# Patient Record
Sex: Female | Born: 1935 | Race: White | Hispanic: No | Smoking: Never smoker
Health system: Southern US, Community
[De-identification: ages and names within clinical notes are randomized; demographics above are authoritative.]

## PROBLEM LIST (undated history)

## (undated) DIAGNOSIS — G8929 Other chronic pain: Secondary | ICD-10-CM

## (undated) DIAGNOSIS — M1990 Unspecified osteoarthritis, unspecified site: Secondary | ICD-10-CM

## (undated) DIAGNOSIS — I10 Essential (primary) hypertension: Secondary | ICD-10-CM

## (undated) DIAGNOSIS — C50919 Malignant neoplasm of unspecified site of unspecified female breast: Secondary | ICD-10-CM

## (undated) HISTORY — PX: APPENDECTOMY: SHX54

## (undated) HISTORY — PX: OTHER SURGICAL HISTORY: SHX169

## (undated) HISTORY — PX: ABDOMINAL HYSTERECTOMY: SHX81

---

## 2011-05-05 ENCOUNTER — Emergency Department (HOSPITAL_COMMUNITY): Payer: Medicare Other

## 2011-05-05 ENCOUNTER — Observation Stay (HOSPITAL_COMMUNITY)
Admission: EM | Admit: 2011-05-05 | Discharge: 2011-05-09 | Disposition: A | Discharge status: Home Health | Payer: Medicare Other | Attending: Internal Medicine | Admitting: Internal Medicine

## 2011-05-05 ENCOUNTER — Encounter: Payer: Self-pay | Admitting: Emergency Medicine

## 2011-05-05 DIAGNOSIS — E119 Type 2 diabetes mellitus without complications: Secondary | ICD-10-CM | POA: Diagnosis present

## 2011-05-05 DIAGNOSIS — M25551 Pain in right hip: Secondary | ICD-10-CM | POA: Diagnosis present

## 2011-05-05 DIAGNOSIS — Z9181 History of falling: Secondary | ICD-10-CM | POA: Insufficient documentation

## 2011-05-05 DIAGNOSIS — M25559 Pain in unspecified hip: Secondary | ICD-10-CM | POA: Insufficient documentation

## 2011-05-05 DIAGNOSIS — E162 Hypoglycemia, unspecified: Secondary | ICD-10-CM

## 2011-05-05 DIAGNOSIS — Z79899 Other long term (current) drug therapy: Secondary | ICD-10-CM | POA: Insufficient documentation

## 2011-05-05 DIAGNOSIS — Z66 Do not resuscitate: Secondary | ICD-10-CM | POA: Insufficient documentation

## 2011-05-05 DIAGNOSIS — M25552 Pain in left hip: Secondary | ICD-10-CM

## 2011-05-05 DIAGNOSIS — M169 Osteoarthritis of hip, unspecified: Secondary | ICD-10-CM | POA: Insufficient documentation

## 2011-05-05 DIAGNOSIS — R531 Weakness: Secondary | ICD-10-CM

## 2011-05-05 DIAGNOSIS — I10 Essential (primary) hypertension: Secondary | ICD-10-CM | POA: Diagnosis present

## 2011-05-05 DIAGNOSIS — M1610 Unilateral primary osteoarthritis, unspecified hip: Principal | ICD-10-CM | POA: Insufficient documentation

## 2011-05-05 DIAGNOSIS — C50919 Malignant neoplasm of unspecified site of unspecified female breast: Secondary | ICD-10-CM | POA: Diagnosis present

## 2011-05-05 HISTORY — DX: Other chronic pain: G89.29

## 2011-05-05 HISTORY — DX: Unspecified osteoarthritis, unspecified site: M19.90

## 2011-05-05 HISTORY — DX: Malignant neoplasm of unspecified site of unspecified female breast: C50.919

## 2011-05-05 HISTORY — DX: Essential (primary) hypertension: I10

## 2011-05-05 LAB — GLUCOSE, CAPILLARY
Glucose-Capillary: 50 mg/dL — ABNORMAL LOW (ref 70–99)
Glucose-Capillary: 73 mg/dL (ref 70–99)
Glucose-Capillary: 101 mg/dL — ABNORMAL HIGH (ref 70–99)

## 2011-05-05 LAB — CBC
MCHC: 32.2 g/dL (ref 30.0–36.0)
Platelets: 263 10*3/uL (ref 150–400)
RDW: 14.5 % (ref 11.5–15.5)
WBC: 9.7 10*3/uL (ref 4.0–10.5)

## 2011-05-05 LAB — BASIC METABOLIC PANEL
Chloride: 107 mEq/L (ref 96–112)
Creatinine, Ser: 0.99 mg/dL (ref 0.50–1.10)
GFR calc Af Amer: 54 mL/min — ABNORMAL LOW (ref >90)
Sodium: 143 mEq/L (ref 135–145)

## 2011-05-05 LAB — DIFFERENTIAL
Basophils Absolute: 0 10*3/uL (ref 0.0–0.1)
Basophils Relative: 0 % (ref 0–1)
Monocytes Absolute: 0.7 10*3/uL (ref 0.1–1.0)
Neutro Abs: 7.4 10*3/uL (ref 1.7–7.7)
Neutrophils Relative %: 76 % (ref 43–77)

## 2011-05-05 MED ORDER — MORPHINE SULFATE 2 MG/ML IJ SOLN: 1.0000 mg | Freq: Once | INTRAMUSCULAR | Status: DC

## 2011-05-05 MED ORDER — LETROZOLE 2.5 MG PO TABS
2.5000 mg | ORAL_TABLET | Freq: Every day | ORAL | Status: DC
  Administered 2011-05-06 – 2011-05-09 (×4): 2.5 mg via ORAL
  Filled 2011-05-05 (×8): qty 1

## 2011-05-05 MED ORDER — INSULIN GLARGINE 100 UNIT/ML ~~LOC~~ SOLN: 20.0000 [IU] | Freq: Every day | SUBCUTANEOUS | Status: DC

## 2011-05-05 MED ORDER — MORPHINE SULFATE 10 MG/ML IJ SOLN
2.0000 mg | Freq: Once | INTRAMUSCULAR | Status: AC
  Administered 2011-05-05: 2 mg via INTRAMUSCULAR
  Filled 2011-05-05: qty 1

## 2011-05-05 MED ORDER — MORPHINE SULFATE 4 MG/ML IJ SOLN
INTRAMUSCULAR | Status: AC
  Administered 2011-05-05: 2 mg
  Filled 2011-05-05: qty 1

## 2011-05-05 MED ORDER — SENNOSIDES-DOCUSATE SODIUM 8.6-50 MG PO TABS
1.0000 | ORAL_TABLET | Freq: Every day | ORAL | Status: DC | PRN
  Filled 2011-05-05: qty 1

## 2011-05-05 MED ORDER — SODIUM CHLORIDE 0.9 % IV SOLN
Freq: Once | INTRAVENOUS | Status: AC
  Administered 2011-05-05: 12:00:00 via INTRAVENOUS

## 2011-05-05 MED ORDER — GADOBENATE DIMEGLUMINE 529 MG/ML IV SOLN
15.0000 mL | Freq: Once | INTRAVENOUS | Status: AC | PRN
  Administered 2011-05-05: 15 mL via INTRAVENOUS

## 2011-05-05 MED ORDER — HYDROCHLOROTHIAZIDE 25 MG PO TABS
25.0000 mg | ORAL_TABLET | Freq: Every day | ORAL | Status: DC
  Administered 2011-05-06 – 2011-05-09 (×4): 25 mg via ORAL
  Filled 2011-05-05 (×7): qty 1

## 2011-05-05 MED ORDER — POTASSIUM CHLORIDE CRYS ER 20 MEQ PO TBCR
20.0000 meq | EXTENDED_RELEASE_TABLET | Freq: Every day | ORAL | Status: DC
  Administered 2011-05-06 – 2011-05-09 (×4): 20 meq via ORAL
  Filled 2011-05-05 (×4): qty 1

## 2011-05-05 MED ORDER — SODIUM CHLORIDE 0.9 % IJ SOLN
INTRAMUSCULAR | Status: AC
  Administered 2011-05-05: 10 mL
  Filled 2011-05-05: qty 3

## 2011-05-05 MED ORDER — ONDANSETRON HCL 4 MG PO TABS: 4.0000 mg | ORAL_TABLET | Freq: Four times a day (QID) | ORAL | Status: DC | PRN

## 2011-05-05 MED ORDER — ACETAMINOPHEN 325 MG PO TABS: 650.0000 mg | ORAL_TABLET | Freq: Four times a day (QID) | ORAL | Status: DC | PRN

## 2011-05-05 MED ORDER — LORAZEPAM 2 MG/ML IJ SOLN
1.0000 mg | INTRAMUSCULAR | Status: DC | PRN
  Administered 2011-05-05 – 2011-05-06 (×3): 1 mg via INTRAVENOUS
  Filled 2011-05-05 (×3): qty 1

## 2011-05-05 MED ORDER — SODIUM CHLORIDE 0.9 % IV SOLN
INTRAVENOUS | Status: DC
  Administered 2011-05-05 – 2011-05-06 (×2): via INTRAVENOUS

## 2011-05-05 MED ORDER — ZOLPIDEM TARTRATE 5 MG PO TABS: 5.0000 mg | ORAL_TABLET | Freq: Every evening | ORAL | Status: DC | PRN

## 2011-05-05 MED ORDER — HYDROCODONE-ACETAMINOPHEN 5-325 MG PO TABS: 1.0000 | ORAL_TABLET | ORAL | Status: DC | PRN

## 2011-05-05 MED ORDER — ONDANSETRON HCL 4 MG/2ML IJ SOLN: 4.0000 mg | Freq: Four times a day (QID) | INTRAMUSCULAR | Status: DC | PRN

## 2011-05-05 MED ORDER — HEPARIN SODIUM (PORCINE) 5000 UNIT/ML IJ SOLN
5000.0000 [IU] | Freq: Three times a day (TID) | INTRAMUSCULAR | Status: DC
  Administered 2011-05-05 – 2011-05-09 (×12): 5000 [IU] via SUBCUTANEOUS
  Filled 2011-05-05 (×19): qty 1

## 2011-05-05 MED ORDER — MORPHINE SULFATE 10 MG/ML IJ SOLN: 2.0000 mg | Freq: Once | INTRAMUSCULAR | Status: DC

## 2011-05-05 MED ORDER — BACITRACIN ZINC 500 UNIT/GM EX OINT
TOPICAL_OINTMENT | CUTANEOUS | Status: AC
  Filled 2011-05-05: qty 0.9

## 2011-05-05 MED ORDER — OXYCODONE HCL 5 MG PO TABS
5.0000 mg | ORAL_TABLET | ORAL | Status: DC | PRN
  Administered 2011-05-05 – 2011-05-08 (×9): 5 mg via ORAL
  Filled 2011-05-05 (×9): qty 1

## 2011-05-05 MED ORDER — ACETAMINOPHEN 650 MG RE SUPP: 650.0000 mg | Freq: Four times a day (QID) | RECTAL | Status: DC | PRN

## 2011-05-05 MED ORDER — MORPHINE SULFATE 2 MG/ML IJ SOLN: 2.0000 mg | INTRAMUSCULAR | Status: DC | PRN

## 2011-05-05 MED ORDER — ONDANSETRON 4 MG PO TBDP
4.0000 mg | ORAL_TABLET | Freq: Once | ORAL | Status: AC
  Administered 2011-05-05: 4 mg via ORAL
  Filled 2011-05-05: qty 1

## 2011-05-05 NOTE — ED Provider Notes (Signed)
History     CSN: [id number] Arrival date & time: 05/05/2011  8:36 AM   First MD Initiated Contact with Patient 05/05/11 0845      Chief Complaint  Patient presents with  . Hip Pain    (Consider location/radiation/quality/duration/timing/severity/associated sxs/prior treatment) HPI Comments: Patient went to bathroom early this morning and  c/o left hip and thigh pain after she slipped and fell against the door in her bathroom.  Her daughter states she was with her at the time and she assisted her to the floor.  Daughter denies head or neck injury. Patient denies preceding symptoms  Patient is a 77 y.o. female presenting with hip pain. The history is provided by the patient (daughter).  Hip Pain This is a new problem. The current episode started today. The problem occurs constantly. The problem has been unchanged. Associated symptoms include arthralgias and myalgias. Pertinent negatives include no abdominal pain, chest pain, chills, coughing, fatigue, fever, headaches, joint swelling, nausea, neck pain, numbness, rash, sore throat, visual change, vomiting or weakness. Exacerbated by: movement and palpation. She has tried nothing for the symptoms. The treatment provided no relief.    Past Medical History  Diagnosis Date  . Chronic pain   . Hypertension   . Breast cancer   . Arthritis     Past Surgical History  Procedure Date  . Unknown   . Appendectomy   . Abdominal hysterectomy     partial    History reviewed. No pertinent family history.  History  Substance Use Topics  . Smoking status: Never Smoker   . Smokeless tobacco: Not on file  . Alcohol Use: No    OB History    Grav Para Term Preterm Abortions TAB SAB Ect Mult Living                  Review of Systems  Constitutional: Negative for fever, chills and fatigue.  HENT: Negative for sore throat, trouble swallowing, neck pain and neck stiffness.   Respiratory: Negative for cough, shortness of breath and  wheezing.   Cardiovascular: Negative for chest pain and palpitations.  Gastrointestinal: Negative for nausea, vomiting and abdominal pain.  Genitourinary: Negative for dysuria, hematuria and flank pain.  Musculoskeletal: Positive for myalgias and arthralgias. Negative for back pain and joint swelling.  Skin: Negative for rash.  Neurological: Negative for dizziness, speech difficulty, weakness, numbness and headaches.  Hematological: Does not bruise/bleed easily.  Psychiatric/Behavioral: Negative for confusion.  All other systems reviewed and are negative.    Allergies  Review of patient's allergies indicates no known allergies.  Home Medications  No current outpatient prescriptions on file.  BP 120/67  Pulse 102  Temp(Src) 97.3 F (36.3 C) (Oral)  Resp 19  SpO2 97%  Physical Exam  Nursing note and vitals reviewed. Constitutional: She is oriented to person, place, and time. She appears well-developed and well-nourished. No distress.  HENT:  Head: Normocephalic and atraumatic.  Mouth/Throat: Oropharynx is clear and moist.  Eyes: EOM are normal. Pupils are equal, round, and reactive to light.  Neck: Normal range of motion. Neck supple.  Cardiovascular: Normal rate, regular rhythm and normal heart sounds.   Pulmonary/Chest: Effort normal and breath sounds normal. No respiratory distress. She exhibits no tenderness.  Musculoskeletal: She exhibits tenderness. She exhibits no edema.       Left hip: She exhibits decreased range of motion, tenderness and bony tenderness. She exhibits no swelling, no crepitus, no deformity and no laceration.  ROM of the left hip is limited due to level of pain.  No deformity, bruising, abrasions or edema.  No shortening of the left leg on exam.  DP pulse is brisk.    Lymphadenopathy:    She has no cervical adenopathy.  Neurological: She is alert and oriented to person, place, and time. No cranial nerve deficit. She exhibits normal muscle tone.  Coordination normal.  Skin: Skin is warm and dry.    ED Course  Procedures (including critical care time)       MDM   Date: 05/05/2011  Rate: 93  Rhythm: normal sinus rhythm  QRS Axis: normal  Intervals: normal  ST/T Wave abnormalities: normal  Conduction Disutrbances:none  Narrative Interpretation:   Old EKG Reviewed: none available  EKG read by Dr. Brooke Dare    patient has ttp of the left anterolateral hip w/o deformity or external rotation.  Appears uncomfortable  12:37 PM patient continues to be unable to bear weight, blood sugar improved, now c/o generalized dizziness with position change.  Has drank fluids w/o difficulty.  Labs pending, will consult for admission    1:11 PM consulted Dr. Karilyn Cota for admission, will evaluate pt in ED.        Katherine Meyer, [place] 05/05/11 1312

## 2011-05-05 NOTE — ED Notes (Signed)
Blood sugar 50, let RN know

## 2011-05-05 NOTE — ED Notes (Signed)
Pt c/o dizziness with position change, orthostatic vs obtained.  Attempted to transfer pt to bedside commode, pt unable to stand, PA notified.  Returned pt to bed, repositioned for comfort.  Female urinal used.  nad noted at this time.

## 2011-05-05 NOTE — ED Notes (Signed)
Lunch tray given.  Pt more comfortable, no longer rigid/moaning.  Pt relaxed.  Sitting up eating meal tray.  nad noted.  Family at bedside.

## 2011-05-05 NOTE — ED Provider Notes (Signed)
Medical screening examination/treatment/procedure(s) were conducted as a shared visit with non-physician practitioner(s) and myself.  I personally evaluated the patient during the encounter  L hip Pain after sliding down a door when losing balance.  Difficulty with ambulation and typical is able to ambulate with assistance.  No neuro findings.  No loss bowel or bladder function.  No additional injury  Pain with ROM L hip and L knee.  Unable to ambulate  Pain control, labs, imaging and likely admit  Dayton Bailiff, MD 05/05/11 1354

## 2011-05-05 NOTE — ED Notes (Signed)
Pt was about to fall early this am and daughter caught her to slide down in floor. ems called but then did not want them to take pt to ED. Pt has been c/o pain to L upper leg and L hip since so daughter called EMS again to bring here. Pt states she did fall in bathroom before daughter got to her. Pt is alert/oriented to most. Denies pain to head. No obvious deformity and ROM wnl at this time. No shortening noticed. Pedal pulses strong.

## 2011-05-05 NOTE — ED Notes (Signed)
MRI called stating pt is too combative and agitated to complete MRI.  Dr. Alphonzo Grieve notified and orders received.

## 2011-05-05 NOTE — H&P (Signed)
Katherine Meyer MRN: [id number] DOB/AGE: [birth year] 77 y.o. Primary Care Physician:MARTIN,MARY MARGARET, FNP, FNP Admit date: 05/05/2011 Chief Complaint: Bilateral hip pain. HPI: This 77 year old lady presents to the hospital with right greater than left hip pain after having had a fall at home. She apparently was walking with her walker to the restroom. She suddenly told her daughter that she was about to fall. Her daughter rushed to her aid and the patient fell backwards on top of her daughter, who broke her fall and therefore she did not have a severe fall to the floor. However, after this fall, she was not able to walk. The patient is hard of hearing and therefore I am not able to get a clear history. In particular, she is not able to tell me if she has had significant pain in the hips prior to this fall. Her caregiver, who is at the bedside, tells me that yesterday she was complaining of pain in her lower back.  This patient has been diagnosed with right breast cancer appro.ximately one year ago for which she is taking Femara. In the emergency room, plain x-rays of the pelvis do not show any fracture but severe degenerative  Joint disease.     Past medical history: 1. Right breast cancer diagnosed approximately one year ago. Her oncologist is Dr. Cleone Slim. 2. Hypertension. 3. Diabetes, insulin-dependent.  Past surgical history: 1. Appendectomy. 2. Total abdominal hysterectomy.        Family history: Noncontributory.   social history: She is widowed. She lives with her daughter. She has a caregiver for most of the day time. The patient needs help with dressing and bathing but is able to feed herself. The patient can walk with a walker but spends most of her time in a wheelchair.   Allergies: No Known Allergies  Medications Prior to Admission  Medication Dose Route Frequency Provider Last Rate Last Dose  . 0.9 %  sodium chloride infusion   Intravenous Once Tammy L. Triplett, PA 100  mL/hr at 05/05/11 1157    . 0.9 %  sodium chloride infusion   Intravenous Continuous Nimish C Gosrani      . acetaminophen (TYLENOL) tablet 650 mg  650 mg Oral Q6H PRN Nimish C Gosrani       Or  . acetaminophen (TYLENOL) suppository 650 mg  650 mg Rectal Q6H PRN Nimish C Gosrani      . bacitracin 500 UNIT/GM ointment           . heparin injection 5,000 Units  5,000 Units Subcutaneous Q8H Nimish C Gosrani      . hydrochlorothiazide (HYDRODIURIL) tablet 25 mg  25 mg Oral Daily Nimish C Gosrani      . HYDROcodone-acetaminophen (NORCO) 5-325 MG per tablet 1 tablet  1 tablet Oral Q4H PRN Nimish C Gosrani      . insulin glargine (LANTUS) injection 20 Units  20 Units Subcutaneous Daily Nimish C Gosrani      . letrozole [facility]) tablet 2.5 mg  2.5 mg Oral Daily Nimish C Gosrani      . morphine 2 MG/ML injection 2 mg  2 mg Intravenous Q4H PRN Nimish C Gosrani      . morphine 4 MG/ML injection        2 mg at 05/05/11 0910  . morphine 4 MG/ML injection        2 mg at 05/05/11 1250  . morphine injection 2 mg  2 mg Intramuscular Once Tammy L. Triplett, PA  2 mg at 05/05/11 1021  . ondansetron (ZOFRAN) tablet 4 mg  4 mg Oral Q6H PRN Nimish C Gosrani       Or  . ondansetron (ZOFRAN) injection 4 mg  4 mg Intravenous Q6H PRN Nimish C Gosrani      . ondansetron (ZOFRAN-ODT) disintegrating tablet 4 mg  4 mg Oral Once Tammy L. Triplett, PA   4 mg at 05/05/11 0910  . potassium chloride SA (K-DUR,KLOR-CON) CR tablet 20 mEq  20 mEq Oral Daily Nimish C Gosrani      . senna-docusate (Senokot-S) tablet 1 tablet  1 tablet Oral Daily PRN Nimish C Gosrani      . zolpidem (AMBIEN) tablet 5 mg  5 mg Oral QHS PRN Nimish C Gosrani      . DISCONTD: morphine 2 MG/ML injection 1 mg  1 mg Intravenous Once Tammy L. Triplett, PA      . DISCONTD: morphine injection 2 mg  2 mg Intramuscular Once Tammy L. Triplett, PA       No current outpatient prescriptions on file as of 05/05/2011.       ZOX:WRUEA from the symptoms  mentioned above,there are no other symptoms referable to all systems reviewed.  Physical Exam: Blood pressure 138/64, pulse 88, temperature 97.3 F (36.3 C), temperature source Oral, resp. rate 20, SpO2 98.00%.  She looks very frail and is in pain on minimal movement. Heart sounds are present and normal without murmurs. Lung fields are clear. She is very hard of hearing but appears to be alert and orientated. Right breast examination shows a mass in the lower inner quadrant consistent with malignancy. Her abdomen is soft and nontender. There is no hepatomegaly. The right hip appears to be tender more than her left hip.    Basename 05/05/11 1200  WBC 9.7  NEUTROABS 7.4  HGB 11.7*  HCT 36.3  MCV 87.5  PLT 263    Basename 05/05/11 1200  NA 143  K 3.8  CL 107  CO2 28  GLUCOSE 91  BUN 25*  CREATININE 0.99  CALCIUM 10.0  MG --         Dg Lumbar Spine Complete  05/05/2011  *RADIOLOGY REPORT*  Clinical Data: Post fall, now with hip and back pain  LUMBAR SPINE - COMPLETE 4+ VIEW  Comparison: Pelvic radiographs - earlier same day  Findings:  There are five non-rib bearing lumbar type vertebral bodies.  There is mild scoliotic curvature of the lumbar spine.  No definite anterolisthesis or retrolisthesis.  No definite pars defects. Vertebral body heights are grossly preserved.  There is mild DDD of T12 - L1 and L3 - L4.  Limited visualization of the bilateral SI joints is normal.  Vascular calcifications.  Punctate calcifications overlying the upper abdomen may be pancreatic in etiology.  Severe degenerative change of the bilateral hips. Indeterminate sclerotic lesion within the right pelvis is incompletely evaluated but may represent a bone island.  IMPRESSION: 1.  Diffuse osteopenia without definite fracture. 2.  Mild multilevel DDD, most conspicuous at T12 - L1 and L3 - L4.  Original Report Authenticated By: Waynard Reeds, M.D.   Dg Pelvis 1-2 Views  05/05/2011  *RADIOLOGY REPORT*   Clinical Data: Post fall, now with hip and low back pain.  PELVIS - 1-2 VIEW  Comparison: Left femur radiographs - earlier same day  Findings: Diffuse osteopenia without definite fracture.  There is severe degenerative change of the bilateral hips with bilateral hip joint space loss with  bone on bone articulation, subchondral sclerosis and osteophytosis.  The regional soft tissues are normal.  IMPRESSION: 1.  Diffuse osteopenia without definite fracture.  2.  Severe degenerative change of the hips, with bone on bone articulation and deformity bilaterally.  Original Report Authenticated By: Waynard Reeds, M.D.   Dg Femur Left  05/05/2011  *RADIOLOGY REPORT*  Clinical Data: Post fall, now with hip and low back pain  LEFT FEMUR - 2 VIEW  Comparison: Pelvic radiographs - earlier same day  Findings: Diffuse osteopenia without definite fracture.  There is severe degenerative change and deformity of the left hip joint with bone on bone articulation, subchondral sclerosis and osteophytosis. Limited visualization of the knee demonstrates enthesopathic changes of the superior pole the patella.  Vascular calcifications.  IMPRESSION: 1.  No definite fracture. 2.  Severe degenerative change of the left hip with bone on bone articulation.  Original Report Authenticated By: Waynard Reeds, M.D.   Impression:  1. Painful hips right greater than left. Possible fracture or metastatic disease. 2. Right breast cancer. 3. Hypertension. 4. Diabetes.      Plan: 1. Admit to regular floor. 2. MRI of the pelvis. 3. Analgesia as required. 4. DO NOT RESUSCITATE. Further recommendations will depend on patient's hospital progress. I'm concerned that the patient may not be able to go home but may need a skilled nursing facility on discharge.      GOSRANI,NIMISH C 05/05/2011, 2:11 PM

## 2011-05-05 NOTE — ED Notes (Signed)
Per MRI - pt calm, relaxed for procedure.  Resp even and unlabored prior to leaving MRI dept. After given med.

## 2011-05-05 NOTE — ED Notes (Signed)
Pt given orange juice with 2 packets of sugar due to hypoglycemia.  Pt drank without difficulty.

## 2011-05-06 LAB — CBC
MCH: 28.6 pg (ref 26.0–34.0)
MCHC: 32.5 g/dL (ref 30.0–36.0)
MCV: 88 fL (ref 78.0–100.0)
Platelets: 225 10*3/uL (ref 150–400)
RBC: 3.43 MIL/uL — ABNORMAL LOW (ref 3.87–5.11)

## 2011-05-06 LAB — COMPREHENSIVE METABOLIC PANEL
ALT: 6 U/L (ref 0–35)
AST: 16 U/L (ref 0–37)
Albumin: 2.7 g/dL — ABNORMAL LOW (ref 3.5–5.2)
Chloride: 109 mEq/L (ref 96–112)
Creatinine, Ser: 0.94 mg/dL (ref 0.50–1.10)
Potassium: 3.5 mEq/L (ref 3.5–5.1)
Sodium: 141 mEq/L (ref 135–145)
Total Bilirubin: 0.2 mg/dL — ABNORMAL LOW (ref 0.3–1.2)

## 2011-05-06 LAB — GLUCOSE, CAPILLARY
Glucose-Capillary: 66 mg/dL — ABNORMAL LOW (ref 70–99)
Glucose-Capillary: 140 mg/dL — ABNORMAL HIGH (ref 70–99)

## 2011-05-06 MED ORDER — INSULIN GLARGINE 100 UNIT/ML ~~LOC~~ SOLN
10.0000 [IU] | Freq: Every day | SUBCUTANEOUS | Status: DC
  Administered 2011-05-07 – 2011-05-09 (×3): 10 [IU] via SUBCUTANEOUS
  Filled 2011-05-06 (×2): qty 3

## 2011-05-06 MED ORDER — DEXTROSE 50 % IV SOLN
INTRAVENOUS | Status: AC
  Filled 2011-05-06: qty 50

## 2011-05-06 MED ORDER — DEXTROSE 50 % IV SOLN
25.0000 mL | Freq: Once | INTRAVENOUS | Status: AC | PRN
  Administered 2011-05-06: 25 mL via INTRAVENOUS

## 2011-05-06 MED ORDER — SODIUM CHLORIDE 0.9 % IJ SOLN
INTRAMUSCULAR | Status: AC
  Filled 2011-05-06: qty 3

## 2011-05-06 NOTE — Progress Notes (Signed)
UR Chart Review Completed  

## 2011-05-06 NOTE — Progress Notes (Signed)
Subjective: This lady was admitted having had a fall. There was no real evidence of fracture. She had an MRI of the pelvis which does not confirm any large fracture although there is a possible fracture in the sacrococcygeal region which is not of any significance to require any kind of surgery. Certainly there is no evidence of hip fracture. She does require pain medicines. She has not had any physical therapy yet but she will do so later on today. She's been somewhat sleepy since she was given intravenous Ativan.           Physical Exam: Blood pressure 157/75, pulse 85, temperature 98.5 F (36.9 C), temperature source Oral, resp. rate 18, height 5\' 4"  (1.626 m), weight 54.2 kg (119 lb 7.8 oz), SpO2 93.00%. She is arousable. Heart sounds are present and normal. Lung fields are clear. There are no new physical findings.   Investigations:     Basic Metabolic Panel:  Basename 05/06/11 0621 05/05/11 1200  NA 141 143  K 3.5 3.8  CL 109 107  CO2 28 28  GLUCOSE 67* 91  BUN 20 25*  CREATININE 0.94 0.99  CALCIUM 9.1 10.0  MG -- --  PHOS -- --   Liver Function Tests:  [facility] 05/06/11 0621  AST 16  ALT 6  ALKPHOS 53  BILITOT 0.2*  PROT 5.6*  ALBUMIN 2.7*     CBC:  Basename 05/06/11 0621 05/05/11 1200  WBC 5.7 9.7  NEUTROABS -- 7.4  HGB 9.8* 11.7*  HCT 30.2* 36.3  MCV 88.0 87.5  PLT 225 263    Dg Lumbar Spine Complete  05/05/2011  *RADIOLOGY REPORT*  Clinical Data: Post fall, now with hip and back pain  LUMBAR SPINE - COMPLETE 4+ VIEW  Comparison: Pelvic radiographs - earlier same day  Findings:  There are five non-rib bearing lumbar type vertebral bodies.  There is mild scoliotic curvature of the lumbar spine.  No definite anterolisthesis or retrolisthesis.  No definite pars defects. Vertebral body heights are grossly preserved.  There is mild DDD of T12 - L1 and L3 - L4.  Limited visualization of the bilateral SI joints is normal.  Vascular calcifications.   Punctate calcifications overlying the upper abdomen may be pancreatic in etiology.  Severe degenerative change of the bilateral hips. Indeterminate sclerotic lesion within the right pelvis is incompletely evaluated but may represent a bone island.  IMPRESSION: 1.  Diffuse osteopenia without definite fracture. 2.  Mild multilevel DDD, most conspicuous at T12 - L1 and L3 - L4.  Original Report Authenticated By: Waynard Reeds, M.D.   Dg Pelvis 1-2 Views  05/05/2011  *RADIOLOGY REPORT*  Clinical Data: Post fall, now with hip and low back pain.  PELVIS - 1-2 VIEW  Comparison: Left femur radiographs - earlier same day  Findings: Diffuse osteopenia without definite fracture.  There is severe degenerative change of the bilateral hips with bilateral hip joint space loss with bone on bone articulation, subchondral sclerosis and osteophytosis.  The regional soft tissues are normal.  IMPRESSION: 1.  Diffuse osteopenia without definite fracture.  2.  Severe degenerative change of the hips, with bone on bone articulation and deformity bilaterally.  Original Report Authenticated By: Waynard Reeds, M.D.   Dg Femur Left  05/05/2011  *RADIOLOGY REPORT*  Clinical Data: Post fall, now with hip and low back pain  LEFT FEMUR - 2 VIEW  Comparison: Pelvic radiographs - earlier same day  Findings: Diffuse osteopenia without definite fracture.  There is severe degenerative change and deformity of the left hip joint with bone on bone articulation, subchondral sclerosis and osteophytosis. Limited visualization of the knee demonstrates enthesopathic changes of the superior pole the patella.  Vascular calcifications.  IMPRESSION: 1.  No definite fracture. 2.  Severe degenerative change of the left hip with bone on bone articulation.  Original Report Authenticated By: Waynard Reeds, M.D.   Mr Pelvis W Wo Contrast  05/05/2011  *RADIOLOGY REPORT*  Clinical Data: Right and left hip pain.  Fall.  History breast cancer.  Renal  insufficiency.  MRI PELVIS WITHOUT AND WITH CONTRAST  Technique:  Multiplanar multisequence MR imaging of the pelvis was performed both before and after administration of intravenous contrast.  Contrast: 6 mL MULTIHANCE GADOBENATE DIMEGLUMINE 529 MG/ML IV SOLN  Comparison: 05/05/2011 radiographs  Findings: The patient was unable to cooperate with imaging, resulting in motion artifact which reduces diagnostic sensitivity and specificity.  Markedly severe degenerative arthropathy of the hips noted, with subsidence of the femoral heads, severe spurring, small bilateral hip joint effusions, and subcortical edema in both the left femoral head and left acetabulum.  Scattered degenerative subcortical cystic lesions are present.  Linear low T1 signal subcortically in the right femoral head could represent prior AVN or chronic stress fractures; no surrounding marrow edema is observed to suggest that this is an acute right femoral head fracture.  Abnormal edema tracts deep to the left iliac this muscle, and there is mild left iliopsoas bursitis.  There is also low-level edema in the left gluteus minimus and medius muscles.  Edema tracks in the hip adductor musculature, left greater than right  No significant abnormal edema or enhancement is observed in the pubic rami.  No compelling findings for osseous metastatic disease of the pelvis.  The proximal hamstring tendons appear intact.  Lumbar spondylosis noted without definite vertebral edema in the lower lumbar spine. There is low-level edema and enhancement at the sacrococcygeal junction, possibly representing a fracture in this vicinity. Unfortunately we were unable to obtain sagittal images.  No impingement at the sciatic notch noted.  No significant abnormal sciatic nerve enhancement.  IMPRESSION:  1.  Markedly severe degenerative arthropathy of the hips, with flattening/remodeling of the femoral heads, markedly severe spurring, and degenerative subcortical marrow edema  and subcortical cystic lesions along the left hip joint.  Left greater than right hip joint effusions noted. Given the lack of leukocytosis, septic hip is not favored. 2.  Potential prior AVN or chronic stress fracture in the subcortical region of the right femoral head, without surrounding marrow edema. 3.  Left iliopsoas bursitis. 4.  Edema tracks in the left hip adductor musculature, and in the left gluteus medius and minimus, possibly related to inflammation. 5.  Lumbar spondylosis. 6.  Transverse linear region of edema and enhancement at the sacrococcygeal junction may represent a small fracture.  Original Report Authenticated By: Dellia Cloud, M.D.      Medications: I have reviewed the patient's current medications.  Impression: 1. Severe degenerative joint disease in the patient's hips. 2. Right breast cancer. 3. Hypertension. 4. Diabetes mellitus.     Plan: 1. Await physical therapy. 2. Continued to alleviate pain. 3. She will likely need disposition to a skilled nursing facility for rehabilitation.     LOS: 1 day   Amiree No C 05/06/2011, 10:07 AM

## 2011-05-06 NOTE — Progress Notes (Signed)
Physical Therapy Evaluation Patient Details Name: Katherine Meyer MRN: [id number] DOB: 1913/02/08 Today's Date: 05/06/2011  Problem List:  Patient Active Problem List  Diagnoses  . Pain, joint, hip, right  . HTN (hypertension)  . DM (diabetes mellitus)  . Breast cancer    Past Medical History:  Past Medical History  Diagnosis Date  . Chronic pain   . Hypertension   . Breast cancer   . Arthritis   . Diabetes mellitus    Past Surgical History:  Past Surgical History  Procedure Date  . Unknown   . Appendectomy   . Abdominal hysterectomy     partial    PT Assessment/Plan/Recommendation PT Assessment Clinical Impression Statement: pt very drowsy, but cooperative...she is extremely HOH...she has pain with movement of either hip L>R...max assist needed with transfers...she had been ambulatory  with walker for functional distance in the home, but now is very fearful of falling...recommend SNF at d/c PT Recommendation/Assessment: Patient will need skilled PT in the acute care venue PT Problem List: Decreased strength;Decreased range of motion;Decreased activity tolerance;Decreased mobility;Decreased safety awareness;Decreased knowledge of precautions;Pain Barriers to Discharge: None PT Therapy Diagnosis : Difficulty walking;Generalized weakness;Acute pain PT Plan PT Frequency: Min 3X/week PT Treatment/Interventions: Gait training;Functional mobility training;Therapeutic exercise;Patient/family education PT Recommendation Follow Up Recommendations: Skilled nursing facility Equipment Recommended: Defer to next venue PT Goals  Acute Rehab PT Goals PT Goal Formulation: With patient/family Time For Goal Achievement: 2 weeks Pt will go Supine/Side to Sit: with mod assist Pt will go Sit to Supine/Side: with mod assist Pt will Transfer Bed to Chair/Chair to Bed: with max assist Pt will Ambulate: 1 - 15 feet;with max assist;with rolling walker  PT  Evaluation Precautions/Restrictions  Precautions Precautions: Fall Required Braces or Orthoses: No Restrictions Weight Bearing Restrictions: No Prior Functioning  Home Living Lives With: Daughter Receives Help From: Family Type of Home: House Home Layout: One level Home Access: Stairs to enter Entrance Stairs-Rails: None Entrance Stairs-Number of Steps: 2 Home Adaptive Equipment: Wheelchair - manual;Walker - rolling;Bedside commode/3-in-1 Prior Function Level of Independence: Needs assistance with homemaking;Needs assistance with ADLs;Needs assistance with gait;Needs assistance with tranfers Driving: No Vocation: Retired Financial risk analyst Arousal/Alertness: Lethargic Overall Cognitive Status: Appears within functional limits for tasks assessed Orientation Level: Oriented to person (daughter states pt is cognitively sound) Sensation/Coordination Sensation Light Touch: Appears Intact Stereognosis: Not tested Hot/Cold: Not tested Proprioception: Not tested Coordination Gross Motor Movements are Fluid and Coordinated: Yes Extremity Assessment RUE Assessment RUE Assessment: Not tested LUE Assessment LUE Assessment: Not tested RLE Assessment RLE Assessment: Exceptions to [facility] RLE PROM (degrees) Right Hip Flexion 0-125: 45  Right Hip ABduction 0-45: 10  LLE Assessment LLE Assessment: Exceptions to WFL LLE PROM (degrees) Left Hip Flexion 0-125: 30 Left Hip ABduction 0-45: 10 Mobility (including Balance) Bed Mobility Bed Mobility: Yes Supine to Sit: 2: Max assist;HOB elevated (Comment degrees) (60 deg) Supine to Sit Details (indicate cue type and reason): has pain while trying to move LEs Sitting - Scoot to Edge of Bed: 1: +1 Total assist Transfers Transfers: Yes Sit to Stand: 2: Max assist;With upper extremity assist;From bed Stand to Sit: 2: Max assist Stand Pivot Transfers: 2: Max Actuary Details (indicate cue type and reason): ggreat  difficulty moving either foot to pivot Ambulation/Gait Ambulation/Gait: No (unable) Stairs: No Wheelchair Mobility Wheelchair Mobility: No  Posture/Postural Control Posture/Postural Control: Postural limitations Postural Limitations: thoracic kyphosis Balance Balance Assessed: Yes Static Sitting Balance Static Sitting - Level of Assistance: 4:  Min assist (probably needs assist due to drowsiness) Exercise    End of Session PT - End of Session Equipment Utilized During Treatment: Gait belt Activity Tolerance: Patient limited by fatigue Patient left: in chair;with call bell in reach;with bed alarm set;with family/visitor present General Behavior During Session: [facility] for tasks performed Cognition: [facility] for tasks performed  Konrad Penta 05/06/2011, 3:34 PM

## 2011-05-07 LAB — BASIC METABOLIC PANEL
BUN: 18 mg/dL (ref 6–23)
Calcium: 9 mg/dL (ref 8.4–10.5)
GFR calc Af Amer: 54 mL/min — ABNORMAL LOW (ref >90)
GFR calc non Af Amer: 47 mL/min — ABNORMAL LOW (ref >90)
Glucose, Bld: 161 mg/dL — ABNORMAL HIGH (ref 70–99)
Sodium: 140 mEq/L (ref 135–145)

## 2011-05-07 LAB — GLUCOSE, CAPILLARY: Glucose-Capillary: 173 mg/dL — ABNORMAL HIGH (ref 70–99)

## 2011-05-07 LAB — CBC
HCT: 30.6 % — ABNORMAL LOW (ref 36.0–46.0)
Hemoglobin: 9.9 g/dL — ABNORMAL LOW (ref 12.0–15.0)
MCH: 28.5 pg (ref 26.0–34.0)
MCHC: 32.4 g/dL (ref 30.0–36.0)
RDW: 14.5 % (ref 11.5–15.5)

## 2011-05-07 MED ORDER — OXYCODONE HCL 5 MG PO TABS
5.0000 mg | ORAL_TABLET | Freq: Three times a day (TID) | ORAL | Status: DC
  Administered 2011-05-07 – 2011-05-08 (×6): 5 mg via ORAL
  Filled 2011-05-07 (×7): qty 1

## 2011-05-07 MED ORDER — SENNOSIDES-DOCUSATE SODIUM 8.6-50 MG PO TABS
1.0000 | ORAL_TABLET | Freq: Every day | ORAL | Status: DC
  Administered 2011-05-07 – 2011-05-08 (×2): 1 via ORAL
  Filled 2011-05-07 (×2): qty 1

## 2011-05-07 NOTE — Progress Notes (Signed)
Subjective: This lady was evaluated by physical therapy yesterday and the recommendation is for her to go to a skilled nursing facility for further rehabilitation. She is experiencing more pain which I think is stopping her moving.           Physical Exam: Blood pressure 115/62, pulse 92, temperature 98.7 F (37.1 C), temperature source Oral, resp. rate 16, height 5\' 4"  (1.626 m), weight 54.2 kg (119 lb 7.8 oz), SpO2 89.00%. She is arousable. Heart sounds are present and normal. Lung fields are clear. There are no new physical findings.   Investigations:     Basic Metabolic Panel:  Basename 05/07/11 0500 05/06/11 0621  NA 140 141  K 3.7 3.5  CL 106 109  CO2 28 28  GLUCOSE 161* 67*  BUN 18 20  CREATININE 0.99 0.94  CALCIUM 9.0 9.1  MG -- --  PHOS -- --   Liver Function Tests:  [facility] 05/06/11 0621  AST 16  ALT 6  ALKPHOS 53  BILITOT 0.2*  PROT 5.6*  ALBUMIN 2.7*     CBC:  Basename 05/07/11 0500 05/06/11 0621 05/05/11 1200  WBC 6.5 5.7 --  NEUTROABS -- -- 7.4  HGB 9.9* 9.8* --  HCT 30.6* 30.2* --  MCV 88.2 88.0 --  PLT 234 225 --    Dg Lumbar Spine Complete  05/05/2011  *RADIOLOGY REPORT*  Clinical Data: Post fall, now with hip and back pain  LUMBAR SPINE - COMPLETE 4+ VIEW  Comparison: Pelvic radiographs - earlier same day  Findings:  There are five non-rib bearing lumbar type vertebral bodies.  There is mild scoliotic curvature of the lumbar spine.  No definite anterolisthesis or retrolisthesis.  No definite pars defects. Vertebral body heights are grossly preserved.  There is mild DDD of T12 - L1 and L3 - L4.  Limited visualization of the bilateral SI joints is normal.  Vascular calcifications.  Punctate calcifications overlying the upper abdomen may be pancreatic in etiology.  Severe degenerative change of the bilateral hips. Indeterminate sclerotic lesion within the right pelvis is incompletely evaluated but may represent a bone island.  IMPRESSION:  1.  Diffuse osteopenia without definite fracture. 2.  Mild multilevel DDD, most conspicuous at T12 - L1 and L3 - L4.  Original Report Authenticated By: Waynard Reeds, M.D.   Dg Pelvis 1-2 Views  05/05/2011  *RADIOLOGY REPORT*  Clinical Data: Post fall, now with hip and low back pain.  PELVIS - 1-2 VIEW  Comparison: Left femur radiographs - earlier same day  Findings: Diffuse osteopenia without definite fracture.  There is severe degenerative change of the bilateral hips with bilateral hip joint space loss with bone on bone articulation, subchondral sclerosis and osteophytosis.  The regional soft tissues are normal.  IMPRESSION: 1.  Diffuse osteopenia without definite fracture.  2.  Severe degenerative change of the hips, with bone on bone articulation and deformity bilaterally.  Original Report Authenticated By: Waynard Reeds, M.D.   Dg Femur Left  05/05/2011  *RADIOLOGY REPORT*  Clinical Data: Post fall, now with hip and low back pain  LEFT FEMUR - 2 VIEW  Comparison: Pelvic radiographs - earlier same day  Findings: Diffuse osteopenia without definite fracture.  There is severe degenerative change and deformity of the left hip joint with bone on bone articulation, subchondral sclerosis and osteophytosis. Limited visualization of the knee demonstrates enthesopathic changes of the superior pole the patella.  Vascular calcifications.  IMPRESSION: 1.  No definite fracture. 2.  Severe degenerative change of the left hip with bone on bone articulation.  Original Report Authenticated By: Waynard Reeds, M.D.   Mr Pelvis W Wo Contrast  05/05/2011  *RADIOLOGY REPORT*  Clinical Data: Right and left hip pain.  Fall.  History breast cancer.  Renal insufficiency.  MRI PELVIS WITHOUT AND WITH CONTRAST  Technique:  Multiplanar multisequence MR imaging of the pelvis was performed both before and after administration of intravenous contrast.  Contrast: 6 mL MULTIHANCE GADOBENATE DIMEGLUMINE 529 MG/ML IV SOLN   Comparison: 05/05/2011 radiographs  Findings: The patient was unable to cooperate with imaging, resulting in motion artifact which reduces diagnostic sensitivity and specificity.  Markedly severe degenerative arthropathy of the hips noted, with subsidence of the femoral heads, severe spurring, small bilateral hip joint effusions, and subcortical edema in both the left femoral head and left acetabulum.  Scattered degenerative subcortical cystic lesions are present.  Linear low T1 signal subcortically in the right femoral head could represent prior AVN or chronic stress fractures; no surrounding marrow edema is observed to suggest that this is an acute right femoral head fracture.  Abnormal edema tracts deep to the left iliac this muscle, and there is mild left iliopsoas bursitis.  There is also low-level edema in the left gluteus minimus and medius muscles.  Edema tracks in the hip adductor musculature, left greater than right  No significant abnormal edema or enhancement is observed in the pubic rami.  No compelling findings for osseous metastatic disease of the pelvis.  The proximal hamstring tendons appear intact.  Lumbar spondylosis noted without definite vertebral edema in the lower lumbar spine. There is low-level edema and enhancement at the sacrococcygeal junction, possibly representing a fracture in this vicinity. Unfortunately we were unable to obtain sagittal images.  No impingement at the sciatic notch noted.  No significant abnormal sciatic nerve enhancement.  IMPRESSION:  1.  Markedly severe degenerative arthropathy of the hips, with flattening/remodeling of the femoral heads, markedly severe spurring, and degenerative subcortical marrow edema and subcortical cystic lesions along the left hip joint.  Left greater than right hip joint effusions noted. Given the lack of leukocytosis, septic hip is not favored. 2.  Potential prior AVN or chronic stress fracture in the subcortical region of the right  femoral head, without surrounding marrow edema. 3.  Left iliopsoas bursitis. 4.  Edema tracks in the left hip adductor musculature, and in the left gluteus medius and minimus, possibly related to inflammation. 5.  Lumbar spondylosis. 6.  Transverse linear region of edema and enhancement at the sacrococcygeal junction may represent a small fracture.  Original Report Authenticated By: Dellia Cloud, M.D.      Medications: I have reviewed the patient's current medications.  Impression: 1. Severe degenerative joint disease in the patient's hips. 2. Right breast cancer. 3. Hypertension. 4. Diabetes mellitus.     Plan: 1. Start scheduled oxycodone 5 mg 3 times a day to control pain. 2. Try to mobilize more today. 3. Disposition: She will require skilled nursing facility for further rehabilitation. Social work is aware of this.     LOS: 2 days   Arek Spadafore C 05/07/2011, 7:59 AM

## 2011-05-08 LAB — GLUCOSE, CAPILLARY: Glucose-Capillary: 119 mg/dL — ABNORMAL HIGH (ref 70–99)

## 2011-05-08 NOTE — Progress Notes (Signed)
Subjective: This lady says she feels somewhat better today. Unfortunately, according to the daughter, she was not given 1 of her scheduled oxycodone doses because apparently she was not in pain at the time. I stressed to the nursing staff to schedule medication should be given as they are scheduled in order to prevent breakthrough pain. She is eating and drinking well.           Physical Exam: Blood pressure 162/72, pulse 98, temperature 97.9 F (36.6 C), temperature source Oral, resp. rate 17, height 5\' 4"  (1.626 m), weight 54.7 kg (120 lb 9.5 oz), SpO2 94.00%. She is arousable. Heart sounds are present and normal. Lung fields are clear. There are no new physical findings.   Investigations:     Basic Metabolic Panel:  Basename 05/07/11 0500 05/06/11 0621  NA 140 141  K 3.7 3.5  CL 106 109  CO2 28 28  GLUCOSE 161* 67*  BUN 18 20  CREATININE 0.99 0.94  CALCIUM 9.0 9.1  MG -- --  PHOS -- --   Liver Function Tests:  [facility] 05/06/11 0621  AST 16  ALT 6  ALKPHOS 53  BILITOT 0.2*  PROT 5.6*  ALBUMIN 2.7*     CBC:  Basename 05/07/11 0500 05/06/11 0621 05/05/11 1200  WBC 6.5 5.7 --  NEUTROABS -- -- 7.4  HGB 9.9* 9.8* --  HCT 30.6* 30.2* --  MCV 88.2 88.0 --  PLT 234 225 --    No results found.    Medications: I have reviewed the patient's current medications.  Impression: 1. Severe degenerative joint disease in the patient's hips. 2. Right breast cancer. 3. Hypertension. 4. Diabetes mellitus.     Plan: 1. Continue with current treatment, especially scheduled oxycodone 3 times a day. Discontinue IV fluids. 2. Hopefully, she can go to a skilled nursing facility tomorrow, pending bed availability.     LOS: 3 days   Tamirah George C 05/08/2011, 8:31 AM

## 2011-05-09 LAB — BASIC METABOLIC PANEL
BUN: 19 mg/dL (ref 6–23)
CO2: 26 mEq/L (ref 19–32)
Calcium: 9.4 mg/dL (ref 8.4–10.5)
Creatinine, Ser: 1.02 mg/dL (ref 0.50–1.10)
Glucose, Bld: 116 mg/dL — ABNORMAL HIGH (ref 70–99)
Sodium: 143 mEq/L (ref 135–145)

## 2011-05-09 LAB — GLUCOSE, CAPILLARY: Glucose-Capillary: 140 mg/dL — ABNORMAL HIGH (ref 70–99)

## 2011-05-09 MED ORDER — INSULIN GLARGINE 100 UNIT/ML ~~LOC~~ SOLN: 10.0000 [IU] | Freq: Every day | SUBCUTANEOUS | Status: DC

## 2011-05-09 MED ORDER — OXYCODONE HCL 5 MG PO TABS
5.0000 mg | ORAL_TABLET | Freq: Four times a day (QID) | ORAL | Status: DC
  Administered 2011-05-09 (×2): 5 mg via ORAL
  Filled 2011-05-09 (×2): qty 1

## 2011-05-09 MED ORDER — SENNA-DOCUSATE SODIUM 8.6-50 MG PO TABS: 1.0000 | ORAL_TABLET | Freq: Every day | ORAL | Status: DC

## 2011-05-09 MED ORDER — OXYCODONE HCL 5 MG PO TABS: 5.0000 mg | ORAL_TABLET | Freq: Four times a day (QID) | ORAL | Status: AC

## 2011-05-09 NOTE — Progress Notes (Signed)
Subjective: This lady has required more oxycodone for breakthrough pain. She is mobilizing very minimally. She is eating and drinking well.          Physical Exam: Blood pressure 150/67, pulse 78, temperature 98.8 F (37.1 C), temperature source Oral, resp. rate 18, height 5\' 4"  (1.626 m), weight 54.7 kg (120 lb 9.5 oz), SpO2 95.00%. She is alert. Heart sounds are present and normal. Lung fields are clear. There are no new physical findings. She still has tenderness in both hips.   Investigations:     Basic Metabolic Panel:  Basename 05/09/11 0530 05/07/11 0500  NA 143 140  K 3.8 3.7  CL 110 106  CO2 26 28  GLUCOSE 116* 161*  BUN 19 18  CREATININE 1.02 0.99  CALCIUM 9.4 9.0  MG -- --  PHOS -- --      CBC:  Basename 05/07/11 0500  WBC 6.5  NEUTROABS --  HGB 9.9*  HCT 30.6*  MCV 88.2  PLT 234        Medications: I have reviewed the patient's current medications.  Impression: 1. Severe degenerative joint disease in the patient's hips. 2. Right breast cancer. 3. Hypertension. 4. Diabetes mellitus.     Plan: 1. Increase oxycodone to 5 mg 4 times a day. 2. Try to mobilize further. This lady, who is 77 years old and frail, is at very high risk for fall if she goes home. I am extremely concerned that she will in fact fall and have a catastrophic fracture. She needs rehabilitation in a skilled nursing facility setting so that her deconditioning and improved and her pain is controlled better. She clearly needed admission for pain control and safety concerns. Furthermore, she has, I feel significant osteoporosis which would put her high-risk for spontaneous fracture. Obviously, if she were to sustain especially a hip fracture her mortality would be increased significantly in the next 6 months. Therefore, I think that rehabilitation for the next couple of weeks in a skilled nursing facility would be very beneficial.     LOS: 4 days   GOSRANI,NIMISH  C 05/09/2011, 9:12 AM

## 2011-05-09 NOTE — Progress Notes (Signed)
UR Chart Review Completed  

## 2011-05-09 NOTE — Progress Notes (Signed)
Physical Therapy Treatment Patient Details Name: Katherine Meyer MRN: [id number] DOB: [birth year] Today's Date: 05/09/2011  TIME: 920-935/ TE  PT Assessment/Plan  PT - Assessment/Plan Comments on Treatment Session: Pt very HOH; even with visual cues patient had difficulty following directions for exercises, however pt was able to complete most exercises demonstrated. Pt was able to complete sit<>stand x5  with Max A  from PTA using patients UEs on PTA's shoulders and use of gait belt;pt had c/o of bilateral hip pain upon standing ;unable to take steps at this time PT Goals  Acute Rehab PT Goals PT Goal: Ambulate - Progress: Progressing toward goal  PT Treatment Precautions/Restrictions  Precautions Precautions: Fall Required Braces or Orthoses: No Restrictions Weight Bearing Restrictions: No Mobility (including Balance) Bed Mobility Bed Mobility: No Transfers Transfers: Yes Sit to Stand: 2: Max assist Sit to Stand Details (indicate cue type and reason): patient UEs on PTA shoulders for assistance to stand Stand to Sit: 2: Max assist Ambulation/Gait Ambulation/Gait: No Stairs: No Wheelchair Mobility Wheelchair Mobility: No    Exercise  General Exercises - Upper Extremity Shoulder Flexion: 5 reps;Both Shoulder Horizontal ABduction: Both;10 reps Elbow Flexion: Both;10 reps General Exercises - Lower Extremity Long Arc Quad: Both;10 reps Hip ABduction/ADduction: Both;10 reps Hip Flexion/Marching: Both (30secs x2) Other Exercises Other Exercises: sit<>stand x5  End of Session PT - End of Session Equipment Utilized During Treatment: Gait belt Activity Tolerance: Patient limited by pain;Other (comment) (pt HOH and inconsisent with visual cues as well) Patient left: in chair;with family/visitor present (chair alarm set) General Behavior During Session: [facility] for tasks performed Cognition: Impaired, at baseline  Katherine Meyer 05/09/2011, 9:51 AM

## 2011-05-09 NOTE — Progress Notes (Signed)
IV removed, site WNL.  Pt and her daughter given d/c instructions and new prescriptions.  Discussed home care with patient and discussed home medications, patient verbalizes understanding. HH has been arranged by care management and have spoken with pt's daughter. F/U appointment in place, pt states they will keep appointment. Pt is stable at this time. Pt taken to main entrance in wheelchair by staff member and helped into daughters car.

## 2011-05-09 NOTE — Discharge Summary (Signed)
Physician Discharge Summary  Patient ID: Katherine Meyer MRN: [id number] DOB/AGE: Oct 11, 1912 77 y.o. Primary Care Physician:MARTIN,MARY MARGARET, FNP, FNP Admit date: 05/05/2011 Discharge date: 05/09/2011    Discharge Diagnoses:  1. Severe degenerative joint disease in the patient's hips. 2. Generalized deconditioning. 3. Right breast cancer with no further investigation or management appropriate. 4. Hypertension. 5. Diabetes mellitus.   Current Discharge Medication List    START taking these medications   Details  oxyCODONE (OXY IR/ROXICODONE) 5 MG immediate release tablet Take 1 tablet (5 mg total) by mouth 4 (four) times daily. Qty: 60 tablet, Refills: 0    sennosides-docusate sodium (SENOKOT-S) 8.6-50 MG tablet Take 1 tablet by mouth daily. Qty: 30 tablet, Refills: 0      CONTINUE these medications which have CHANGED   Details  insulin glargine (LANTUS) 100 UNIT/ML injection Inject 10 Units into the skin daily. Qty: 10 mL, Refills: 0      CONTINUE these medications which have NOT CHANGED   Details  hydrochlorothiazide (HYDRODIURIL) 25 MG tablet Take 25 mg by mouth daily.      letrozole (FEMARA) 2.5 MG tablet Take 2.5 mg by mouth daily.      potassium chloride SA (K-DUR,KLOR-CON) 20 MEQ tablet Take 20 mEq by mouth daily.        STOP taking these medications     HYDROcodone-acetaminophen (VICODIN) 5-500 MG per tablet         Discharged Condition: Home with home health physical therapy.    Consults: None.  Significant Diagnostic Studies: Dg Lumbar Spine Complete  05/05/2011  *RADIOLOGY REPORT*  Clinical Data: Post fall, now with hip and back pain  LUMBAR SPINE - COMPLETE 4+ VIEW  Comparison: Pelvic radiographs - earlier same day  Findings:  There are five non-rib bearing lumbar type vertebral bodies.  There is mild scoliotic curvature of the lumbar spine.  No definite anterolisthesis or retrolisthesis.  No definite pars defects. Vertebral body heights are  grossly preserved.  There is mild DDD of T12 - L1 and L3 - L4.  Limited visualization of the bilateral SI joints is normal.  Vascular calcifications.  Punctate calcifications overlying the upper abdomen may be pancreatic in etiology.  Severe degenerative change of the bilateral hips. Indeterminate sclerotic lesion within the right pelvis is incompletely evaluated but may represent a bone island.  IMPRESSION: 1.  Diffuse osteopenia without definite fracture. 2.  Mild multilevel DDD, most conspicuous at T12 - L1 and L3 - L4.  Original Report Authenticated By: Waynard Reeds, M.D.   Dg Pelvis 1-2 Views  05/05/2011  *RADIOLOGY REPORT*  Clinical Data: Post fall, now with hip and low back pain.  PELVIS - 1-2 VIEW  Comparison: Left femur radiographs - earlier same day  Findings: Diffuse osteopenia without definite fracture.  There is severe degenerative change of the bilateral hips with bilateral hip joint space loss with bone on bone articulation, subchondral sclerosis and osteophytosis.  The regional soft tissues are normal.  IMPRESSION: 1.  Diffuse osteopenia without definite fracture.  2.  Severe degenerative change of the hips, with bone on bone articulation and deformity bilaterally.  Original Report Authenticated By: Waynard Reeds, M.D.   Dg Femur Left  05/05/2011  *RADIOLOGY REPORT*  Clinical Data: Post fall, now with hip and low back pain  LEFT FEMUR - 2 VIEW  Comparison: Pelvic radiographs - earlier same day  Findings: Diffuse osteopenia without definite fracture.  There is severe degenerative change and deformity of the left  hip joint with bone on bone articulation, subchondral sclerosis and osteophytosis. Limited visualization of the knee demonstrates enthesopathic changes of the superior pole the patella.  Vascular calcifications.  IMPRESSION: 1.  No definite fracture. 2.  Severe degenerative change of the left hip with bone on bone articulation.  Original Report Authenticated By: Waynard Reeds,  M.D.   Mr Pelvis W Wo Contrast  05/05/2011  *RADIOLOGY REPORT*  Clinical Data: Right and left hip pain.  Fall.  History breast cancer.  Renal insufficiency.  MRI PELVIS WITHOUT AND WITH CONTRAST  Technique:  Multiplanar multisequence MR imaging of the pelvis was performed both before and after administration of intravenous contrast.  Contrast: 6 mL MULTIHANCE GADOBENATE DIMEGLUMINE 529 MG/ML IV SOLN  Comparison: 05/05/2011 radiographs  Findings: The patient was unable to cooperate with imaging, resulting in motion artifact which reduces diagnostic sensitivity and specificity.  Markedly severe degenerative arthropathy of the hips noted, with subsidence of the femoral heads, severe spurring, small bilateral hip joint effusions, and subcortical edema in both the left femoral head and left acetabulum.  Scattered degenerative subcortical cystic lesions are present.  Linear low T1 signal subcortically in the right femoral head could represent prior AVN or chronic stress fractures; no surrounding marrow edema is observed to suggest that this is an acute right femoral head fracture.  Abnormal edema tracts deep to the left iliac this muscle, and there is mild left iliopsoas bursitis.  There is also low-level edema in the left gluteus minimus and medius muscles.  Edema tracks in the hip adductor musculature, left greater than right  No significant abnormal edema or enhancement is observed in the pubic rami.  No compelling findings for osseous metastatic disease of the pelvis.  The proximal hamstring tendons appear intact.  Lumbar spondylosis noted without definite vertebral edema in the lower lumbar spine. There is low-level edema and enhancement at the sacrococcygeal junction, possibly representing a fracture in this vicinity. Unfortunately we were unable to obtain sagittal images.  No impingement at the sciatic notch noted.  No significant abnormal sciatic nerve enhancement.  IMPRESSION:  1.  Markedly severe  degenerative arthropathy of the hips, with flattening/remodeling of the femoral heads, markedly severe spurring, and degenerative subcortical marrow edema and subcortical cystic lesions along the left hip joint.  Left greater than right hip joint effusions noted. Given the lack of leukocytosis, septic hip is not favored. 2.  Potential prior AVN or chronic stress fracture in the subcortical region of the right femoral head, without surrounding marrow edema. 3.  Left iliopsoas bursitis. 4.  Edema tracks in the left hip adductor musculature, and in the left gluteus medius and minimus, possibly related to inflammation. 5.  Lumbar spondylosis. 6.  Transverse linear region of edema and enhancement at the sacrococcygeal junction may represent a small fracture.  Original Report Authenticated By: Dellia Cloud, M.D.    Lab Results: Basic Metabolic Panel:  Basename 05/09/11 0530 05/07/11 0500  NA 143 140  K 3.8 3.7  CL 110 106  CO2 26 28  GLUCOSE 116* 161*  BUN 19 18  CREATININE 1.02 0.99  CALCIUM 9.4 9.0  MG -- --  PHOS -- --       CBC:  Basename 05/07/11 0500  WBC 6.5  NEUTROABS --  HGB 9.9*  HCT 30.6*  MCV 88.2  PLT 234       Hospital Course: This 77 year old lady was admitted after she had sustained a fall at home. Unfortunately, she did not  fall directly onto the floor but fell onto her daughter who was trying to save her from falling to the floor. MRI of the pelvis did not show any frank fracture of the hip joints. There was questionable fracture in the sacrococcygeal region but this was not definite. She was treated with oral opioids which she tolerated and this seems to have helped her pain. There is no evidence of metastatic disease in relation to her right breast cancer. She was seen by physical therapy who didn't feel that she would benefit from rehabilitation at a skilled nursing facility. However, Medicare, after reviewing the medical records, has denied her to have an  inpatient stay and therefore she cannot go to a skilled nursing facility. This issue has been reviewed by our  Medical Director for utilization review and he also feels that unfortunately she will not qualify for inpatient stay. Therefore, the patient is being sent home with home health and analgesics as required. The patient has done well with oxycodone 5 mg 4 times a day so far.  Discharge Exam: Blood pressure 163/64, pulse 84, temperature 98.2 F (36.8 C), temperature source Oral, resp. rate 18, height 5\' 4"  (1.626 m), weight 54.7 kg (120 lb 9.5 oz), SpO2 94.00%. She is clinically stable. Heart sounds are present and normal. Lung fields are clear. She is alert although hard of hearing and orientated. There are no focal neurological signs.  Disposition: Home with home health physical therapy.  Discharge Orders    Future Orders Please Complete By Expires   Diet - low sodium heart healthy      Increase activity slowly         Follow-up Information    Follow up with Bennie Pierini, FNP .         SignedWilson Singer 05/09/2011, 3:12 PM

## 2011-05-09 NOTE — Progress Notes (Signed)
CSW spoke with CM who reports pt is observation status.  CSW notified pt's daughter and explained that Medicare would not cover SNF under observation.  She states this has happened before.  CSW explained d/c options and she plans to take pt home with home health now.  Pt has around the clock care between daughter and a sitter.  CM aware and will discuss home health.  CSW to sign off.  Katherine Meyer

## 2011-05-09 NOTE — Progress Notes (Signed)
     CARE MANAGEMENT NOTE 05/09/2011  Patient:  NKENGE, SONNTAG   Account Number:  [id number]  Date Initiated:  05/09/2011  Documentation initiated by:  Andi Devon Assessment:   77 yr old female admitted from home s/p fall lives with her daughter. pt has cap apide  daughter requesting snf     Action/Plan:   Anticipated DC Date:  05/09/2011   Anticipated DC Plan:  HOME W HOME HEALTH SERVICES      DC Planning Services  CM consult      [facility] Choice  HOME HEALTH   Choice offered to / List presented to:  C-1 Patient        HH arranged  HH-1 RN  HH-2 PT      [facility] agency  [facility]   Status of service:   Medicare Important Message given?  NA - LOS <3 / Initial given by admissions (If response is "NO", the following Medicare IM given date fields will be blank) Date Medicare IM given:   Date Additional Medicare IM given:    Discharge Disposition:  HOME W HOME HEALTH SERVICES  Per UR Regulation:    Comments:  05/09/2011 Mendel Corning rn bsn daughter had request snf for pt. pt does not qualify  for snf per physicial therapy.discuss with daughter. pt d/c home with home health referral. referral to linda [facility]ian rn of [facility]. daughter in agreement to take pt home as she has a home health aide.

## 2011-10-27 ENCOUNTER — Encounter: Payer: Medicare Other | Admitting: Hematology and Oncology

## 2011-10-27 DIAGNOSIS — I10 Essential (primary) hypertension: Secondary | ICD-10-CM

## 2011-10-27 DIAGNOSIS — C50919 Malignant neoplasm of unspecified site of unspecified female breast: Secondary | ICD-10-CM

## 2011-10-27 DIAGNOSIS — E119 Type 2 diabetes mellitus without complications: Secondary | ICD-10-CM

## 2012-01-26 ENCOUNTER — Encounter: Payer: Medicare Other | Admitting: Hematology and Oncology

## 2012-01-26 DIAGNOSIS — C50919 Malignant neoplasm of unspecified site of unspecified female breast: Secondary | ICD-10-CM

## 2012-01-26 DIAGNOSIS — I10 Essential (primary) hypertension: Secondary | ICD-10-CM

## 2012-01-26 DIAGNOSIS — E119 Type 2 diabetes mellitus without complications: Secondary | ICD-10-CM

## 2012-02-15 ENCOUNTER — Emergency Department (HOSPITAL_COMMUNITY)
Admission: EM | Admit: 2012-02-15 | Discharge: 2012-02-15 | Disposition: A | Discharge status: Home / Self Care | Payer: Medicare Other | Attending: Emergency Medicine | Admitting: Emergency Medicine

## 2012-02-15 ENCOUNTER — Encounter (HOSPITAL_COMMUNITY): Payer: Self-pay

## 2012-02-15 ENCOUNTER — Emergency Department (HOSPITAL_COMMUNITY): Payer: Medicare Other

## 2012-02-15 DIAGNOSIS — I10 Essential (primary) hypertension: Secondary | ICD-10-CM | POA: Insufficient documentation

## 2012-02-15 DIAGNOSIS — G8929 Other chronic pain: Secondary | ICD-10-CM | POA: Insufficient documentation

## 2012-02-15 DIAGNOSIS — Z794 Long term (current) use of insulin: Secondary | ICD-10-CM | POA: Insufficient documentation

## 2012-02-15 DIAGNOSIS — N390 Urinary tract infection, site not specified: Secondary | ICD-10-CM | POA: Insufficient documentation

## 2012-02-15 DIAGNOSIS — E119 Type 2 diabetes mellitus without complications: Secondary | ICD-10-CM | POA: Insufficient documentation

## 2012-02-15 DIAGNOSIS — Z79899 Other long term (current) drug therapy: Secondary | ICD-10-CM | POA: Insufficient documentation

## 2012-02-15 DIAGNOSIS — R443 Hallucinations, unspecified: Secondary | ICD-10-CM | POA: Insufficient documentation

## 2012-02-15 DIAGNOSIS — Z9089 Acquired absence of other organs: Secondary | ICD-10-CM | POA: Insufficient documentation

## 2012-02-15 DIAGNOSIS — Z8739 Personal history of other diseases of the musculoskeletal system and connective tissue: Secondary | ICD-10-CM | POA: Insufficient documentation

## 2012-02-15 DIAGNOSIS — Z853 Personal history of malignant neoplasm of breast: Secondary | ICD-10-CM | POA: Insufficient documentation

## 2012-02-15 LAB — URINALYSIS, ROUTINE W REFLEX MICROSCOPIC
Bilirubin Urine: NEGATIVE
Ketones, ur: NEGATIVE mg/dL
Nitrite: NEGATIVE
Protein, ur: NEGATIVE mg/dL
Specific Gravity, Urine: 1.025 (ref 1.005–1.030)
Urobilinogen, UA: 0.2 mg/dL (ref 0.0–1.0)

## 2012-02-15 LAB — CBC WITH DIFFERENTIAL/PLATELET
Eosinophils Absolute: 0.2 10*3/uL (ref 0.0–0.7)
HCT: 37.1 % (ref 36.0–46.0)
Hemoglobin: 12.6 g/dL (ref 12.0–15.0)
Lymphs Abs: 2.7 10*3/uL (ref 0.7–4.0)
MCH: 28.9 pg (ref 26.0–34.0)
MCV: 85.1 fL (ref 78.0–100.0)
Monocytes Relative: 11 % (ref 3–12)
Neutrophils Relative %: 52 % (ref 43–77)
RBC: 4.36 MIL/uL (ref 3.87–5.11)

## 2012-02-15 LAB — COMPREHENSIVE METABOLIC PANEL
Alkaline Phosphatase: 81 U/L (ref 39–117)
BUN: 28 mg/dL — ABNORMAL HIGH (ref 6–23)
GFR calc Af Amer: 51 mL/min — ABNORMAL LOW (ref >90)
Glucose, Bld: 140 mg/dL — ABNORMAL HIGH (ref 70–99)
Potassium: 3.5 mEq/L (ref 3.5–5.1)
Total Bilirubin: 0.2 mg/dL — ABNORMAL LOW (ref 0.3–1.2)
Total Protein: 7.4 g/dL (ref 6.0–8.3)

## 2012-02-15 LAB — LIPASE, BLOOD: Lipase: 31 U/L (ref 11–59)

## 2012-02-15 MED ORDER — DEXTROSE 5 % IV SOLN
1.0000 g | Freq: Once | INTRAVENOUS | Status: AC
  Administered 2012-02-15: 1 g via INTRAVENOUS
  Filled 2012-02-15: qty 10

## 2012-02-15 MED ORDER — SODIUM CHLORIDE 0.9 % IV BOLUS (SEPSIS)
250.0000 mL | Freq: Once | INTRAVENOUS | Status: AC
  Administered 2012-02-15: 1000 mL via INTRAVENOUS

## 2012-02-15 MED ORDER — CEPHALEXIN 500 MG PO CAPS: 500.0000 mg | ORAL_CAPSULE | Freq: Four times a day (QID) | ORAL | Status: AC

## 2012-02-15 MED ORDER — SODIUM CHLORIDE 0.9 % IV SOLN
INTRAVENOUS | Status: DC
  Administered 2012-02-15: 19:00:00 via INTRAVENOUS

## 2012-02-15 MED ORDER — CEFTRIAXONE SODIUM 1 G IJ SOLR: 1.0000 g | Freq: Once | INTRAMUSCULAR | Status: DC

## 2012-02-15 NOTE — ED Notes (Addendum)
Pt daughter states for the past 2 days the patient has been experiencing auditory and visual hallucinations. Pt hearing singing and seeing things that are not there. Pt calm and cooperative with no signs of distress.

## 2012-02-15 NOTE — ED Notes (Signed)
Per daughter, pt has been "seeing things that aren't there".  States she has been seeing people moving in and out of the house next door to hers, young children riding bicycles in her drive way, and "all those people dressed in white" in her neighbor's front yard.  Per daughter, none of these things actually happened. Pt states she is here to be seen for "pins and needles" in her hands (which per daughter is chronic) and then a few minutes later states she is here to be seen "for my eyes".  Pt is extremely HOH.

## 2012-02-15 NOTE — ED Provider Notes (Signed)
History     CSN: [id number]  Arrival date & time 02/15/12  1643   First MD Initiated Contact with Patient 02/15/12 1659      Chief Complaint  Patient presents with  . Hallucinations    (Consider location/radiation/quality/duration/timing/severity/associated sxs/prior treatment) The history is provided by the patient and a relative.   patient is a 78 year old female followed by [facility]. Patient brought in by her daughter for a 2 day history of auditory and visual hallucinations. This is unusual for her. No fever no nausea vomiting or diarrhea no abdominal pain no chest pain.  Patient does have some confusion here not severe level V caveat applies to the history. Patient has had a history of some confusion in the past related to urinary tract infections.  Past Medical History  Diagnosis Date  . Chronic pain   . Hypertension   . Breast cancer   . Arthritis   . Diabetes mellitus     Past Surgical History  Procedure Date  . Unknown   . Appendectomy   . Abdominal hysterectomy     partial    No family history on file.  History  Substance Use Topics  . Smoking status: Never Smoker   . Smokeless tobacco: Not on file  . Alcohol Use: No    OB History    Grav Para Term Preterm Abortions TAB SAB Ect Mult Living                  Review of Systems  Unable to perform ROS Constitutional: Negative for fever.  HENT: Negative for congestion.   Eyes: Positive for visual disturbance.  Cardiovascular: Negative for leg swelling.  Gastrointestinal: Negative for nausea, vomiting and diarrhea.  Skin: Negative for rash.  Neurological: Negative for weakness.  Hematological: Does not bruise/bleed easily.  Psychiatric/Behavioral: Positive for confusion.   unable to perform review of systems do to the mental status changes. Some information provided by the daughter those have been included.  Allergies  Tylenol  Home Medications   Current Outpatient Rx   Name Route Sig Dispense Refill  . AMLODIPINE BESYLATE 5 MG PO TABS Oral Take 5 mg by mouth every morning.    Marland Kitchen HYDROCHLOROTHIAZIDE 25 MG PO TABS Oral Take 25 mg by mouth every morning.     Marland Kitchen HYDROCODONE-ACETAMINOPHEN 5-500 MG PO TABS Oral Take 0.5 tablets by mouth every 6 (six) hours as needed. FOR PAIN    . INSULIN GLARGINE 100 UNIT/ML [place] SOLN Subcutaneous Inject 10 Units into the skin every morning.    Marland Kitchen LETROZOLE 2.5 MG PO TABS Oral Take 2.5 mg by mouth every morning.     Marland Kitchen LORAZEPAM 0.5 MG PO TABS Oral Take 0.25 mg by mouth 2 (two) times daily as needed. FOR ANXIETY    . POTASSIUM CHLORIDE CRYS ER 10 MEQ PO TBCR Oral Take 10 mEq by mouth every morning.    . CEPHALEXIN 500 MG PO CAPS Oral Take 1 capsule (500 mg total) by mouth 4 (four) times daily. 28 capsule 0    BP 150/61  Pulse 81  Temp 97.4 F (36.3 C) (Oral)  Resp 16  Wt 96 lb (43.545 kg)  SpO2 95%  Physical Exam  Nursing note and vitals reviewed. Constitutional: She is oriented to person, place, and time. She appears well-developed and well-nourished.  HENT:  Head: Normocephalic and atraumatic.  Mouth/Throat: Oropharynx is clear and moist.  Eyes: Conjunctivae normal and EOM are normal. Pupils are equal, round,  and reactive to light.  Neck: Normal range of motion. Neck supple. No thyromegaly present.  Cardiovascular: Normal rate, regular rhythm and normal heart sounds.   No murmur heard. Pulmonary/Chest: Effort normal and breath sounds normal.  Abdominal: Soft. Bowel sounds are normal. There is no tenderness.  Musculoskeletal: Normal range of motion. She exhibits no edema.  Lymphadenopathy:    She has no cervical adenopathy.  Neurological: She is alert and oriented to person, place, and time. No cranial nerve deficit. She exhibits normal muscle tone. Coordination normal.  Skin: Skin is warm. No rash noted.    ED Course  Procedures (including critical care time)  Labs Reviewed  COMPREHENSIVE METABOLIC PANEL -  Abnormal; Notable for the following:    Glucose, Bld 140 (*)     BUN 28 (*)     Total Bilirubin 0.2 (*)     GFR calc non Af Amer 44 (*)     GFR calc Af Amer 51 (*)     All other components within normal limits  URINALYSIS, ROUTINE W REFLEX MICROSCOPIC - Abnormal; Notable for the following:    APPearance HAZY (*)     Leukocytes, UA MODERATE (*)     All other components within normal limits  URINE MICROSCOPIC-ADD ON - Abnormal; Notable for the following:    Squamous Epithelial / LPF FEW (*)     Bacteria, UA FEW (*)     Casts WBC CAST (*)     All other components within normal limits  CBC WITH DIFFERENTIAL  LIPASE, BLOOD  URINE CULTURE   Dg Chest 1 View  02/15/2012  *RADIOLOGY REPORT*  Clinical Data: Hallucinations, altered mental status, history of breast cancer  CHEST - 1 VIEW  Comparison: Prior chest x-ray 01/13/2011  Findings: Negative for pulmonary edema, or focal airspace consolidation.  Unchanged mild cardiomegaly.  Aortic atherosclerosis.  Chronic interstitial changes appear similar to prior.  No acute osseous abnormality.  IMPRESSION: 1. No acute cardiopulmonary disease 2.  Stable mild cardiomegaly, aortic atherosclerosis, and chronic lung changes   Original Report Authenticated By: Threasa Beards Head Wo Contrast  02/15/2012  *RADIOLOGY REPORT*  Clinical Data: Hallucinations.  CT HEAD WITHOUT CONTRAST  Technique:  Contiguous axial images were obtained from the base of the skull through the vertex without contrast.  Comparison: 01/13/2011  Findings: There is atrophy and chronic small vessel disease changes. No acute intracranial abnormality.  Specifically, no hemorrhage, hydrocephalus, mass lesion, acute infarction, or significant intracranial injury.  No acute calvarial abnormality. Visualized paranasal sinuses and mastoids clear.  Orbital soft tissues unremarkable.  IMPRESSION: No acute intracranial abnormality.  Atrophy, chronic microvascular disease.   Original Report Authenticated By:  Cyndie Chime, M.D.    Results for orders placed during the hospital encounter of 02/15/12  CBC WITH DIFFERENTIAL      Component Value Range   WBC 7.7  4.0 - 10.5 K/uL   RBC 4.36  3.87 - 5.11 MIL/uL   Hemoglobin 12.6  12.0 - 15.0 g/dL   HCT 16.1  09.6 - 04.5 %   MCV 85.1  78.0 - 100.0 fL   MCH 28.9  26.0 - 34.0 pg   MCHC 34.0  30.0 - 36.0 g/dL   RDW 40.9  81.1 - 91.4 %   Platelets 278  150 - 400 K/uL   Neutrophils Relative 52  43 - 77 %   Neutro Abs 4.0  1.7 - 7.7 K/uL   Lymphocytes Relative 35  12 - 46 %  Lymphs Abs 2.7  0.7 - 4.0 K/uL   Monocytes Relative 11  3 - 12 %   Monocytes Absolute 0.8  0.1 - 1.0 K/uL   Eosinophils Relative 2  0 - 5 %   Eosinophils Absolute 0.2  0.0 - 0.7 K/uL   Basophils Relative 0  0 - 1 %   Basophils Absolute 0.0  0.0 - 0.1 K/uL  COMPREHENSIVE METABOLIC PANEL      Component Value Range   Sodium 140  135 - 145 mEq/L   Potassium 3.5  3.5 - 5.1 mEq/L   Chloride 104  96 - 112 mEq/L   CO2 25  19 - 32 mEq/L   Glucose, Bld 140 (*) 70 - 99 mg/dL   BUN 28 (*) 6 - 23 mg/dL   Creatinine, Ser 1.19  0.50 - 1.10 mg/dL   Calcium 9.9  8.4 - 14.7 mg/dL   Total Protein 7.4  6.0 - 8.3 g/dL   Albumin 3.7  3.5 - 5.2 g/dL   AST 16  0 - 37 U/L   ALT 8  0 - 35 U/L   Alkaline Phosphatase 81  39 - 117 U/L   Total Bilirubin 0.2 (*) 0.3 - 1.2 mg/dL   GFR calc non Af Amer 44 (*) >90 mL/min   GFR calc Af Amer 51 (*) >90 mL/min  LIPASE, BLOOD      Component Value Range   Lipase 31  11 - 59 U/L  URINALYSIS, ROUTINE W REFLEX MICROSCOPIC      Component Value Range   Color, Urine YELLOW  YELLOW   APPearance HAZY (*) CLEAR   Specific Gravity, Urine 1.025  1.005 - 1.030   pH 5.5  5.0 - 8.0   Glucose, UA NEGATIVE  NEGATIVE mg/dL   Hgb urine dipstick NEGATIVE  NEGATIVE   Bilirubin Urine NEGATIVE  NEGATIVE   Ketones, ur NEGATIVE  NEGATIVE mg/dL   Protein, ur NEGATIVE  NEGATIVE mg/dL   Urobilinogen, UA 0.2  0.0 - 1.0 mg/dL   Nitrite NEGATIVE  NEGATIVE   Leukocytes, UA  MODERATE (*) NEGATIVE  URINE MICROSCOPIC-ADD ON      Component Value Range   Squamous Epithelial / LPF FEW (*) RARE   WBC, UA 21-50  <3 WBC/hpf   RBC / HPF 0-2  <3 RBC/hpf   Bacteria, UA FEW (*) RARE   Casts WBC CAST (*) NEGATIVE    Date: 02/15/2012  Rate: 82  Rhythm: normal sinus rhythm  QRS Axis: normal  Intervals: normal  ST/T Wave abnormalities: normal  Conduction Disutrbances:none  Narrative Interpretation:   Old EKG Reviewed: unchanged From 05/05/11   1. Urinary tract infection       MDM  Patient lives with daughter brought in for 2 day history of visual and some auditory hallucinations. Initially thought could be related to dementia. However workup reveals a significant urinary tract infection this could be the culprit. Patient treated with IV Rocephin 1 g IV piggyback in the emergency department will be discharged home with Keflex. If not better in 1-2 days they will followup here or with her primary care Dr. Eugenio Hoes of the workup was negative to include a CT of the brain without significant findings no significant electrolyte or her blood count abnormalities. Chest x-ray was negative for pneumonia.   Patient can now be discharged home the daughter will be staying with her. Patient has some confusion but it's not severe, admission not required.  Shelda Jakes, MD 02/15/12 2111

## 2012-02-17 LAB — URINE CULTURE

## 2012-04-09 ENCOUNTER — Encounter (HOSPITAL_COMMUNITY): Payer: Self-pay

## 2012-04-09 ENCOUNTER — Emergency Department (HOSPITAL_COMMUNITY): Payer: Medicare Other

## 2012-04-09 ENCOUNTER — Emergency Department (HOSPITAL_COMMUNITY)
Admission: EM | Admit: 2012-04-09 | Discharge: 2012-04-09 | Disposition: A | Discharge status: Home / Self Care | Payer: Medicare Other | Attending: Emergency Medicine | Admitting: Emergency Medicine

## 2012-04-09 DIAGNOSIS — Z853 Personal history of malignant neoplasm of breast: Secondary | ICD-10-CM | POA: Insufficient documentation

## 2012-04-09 DIAGNOSIS — F29 Unspecified psychosis not due to a substance or known physiological condition: Secondary | ICD-10-CM | POA: Insufficient documentation

## 2012-04-09 DIAGNOSIS — Z794 Long term (current) use of insulin: Secondary | ICD-10-CM | POA: Insufficient documentation

## 2012-04-09 DIAGNOSIS — R410 Disorientation, unspecified: Secondary | ICD-10-CM

## 2012-04-09 DIAGNOSIS — Z79899 Other long term (current) drug therapy: Secondary | ICD-10-CM | POA: Insufficient documentation

## 2012-04-09 DIAGNOSIS — J069 Acute upper respiratory infection, unspecified: Secondary | ICD-10-CM

## 2012-04-09 DIAGNOSIS — G8929 Other chronic pain: Secondary | ICD-10-CM | POA: Insufficient documentation

## 2012-04-09 DIAGNOSIS — Z8739 Personal history of other diseases of the musculoskeletal system and connective tissue: Secondary | ICD-10-CM | POA: Insufficient documentation

## 2012-04-09 DIAGNOSIS — I10 Essential (primary) hypertension: Secondary | ICD-10-CM | POA: Insufficient documentation

## 2012-04-09 DIAGNOSIS — E119 Type 2 diabetes mellitus without complications: Secondary | ICD-10-CM | POA: Insufficient documentation

## 2012-04-09 LAB — CBC WITH DIFFERENTIAL/PLATELET
Basophils Absolute: 0 10*3/uL (ref 0.0–0.1)
HCT: 37.5 % (ref 36.0–46.0)
Hemoglobin: 12.6 g/dL (ref 12.0–15.0)
Lymphocytes Relative: 16 % (ref 12–46)
Lymphs Abs: 1 10*3/uL (ref 0.7–4.0)
MCV: 85 fL (ref 78.0–100.0)
Monocytes Absolute: 0.6 10*3/uL (ref 0.1–1.0)
Neutro Abs: 4.7 10*3/uL (ref 1.7–7.7)
RBC: 4.41 MIL/uL (ref 3.87–5.11)
RDW: 13.7 % (ref 11.5–15.5)
WBC: 6.4 10*3/uL (ref 4.0–10.5)

## 2012-04-09 LAB — URINALYSIS, ROUTINE W REFLEX MICROSCOPIC
Glucose, UA: NEGATIVE mg/dL
Hgb urine dipstick: NEGATIVE
Ketones, ur: NEGATIVE mg/dL
Leukocytes, UA: NEGATIVE
pH: 6 (ref 5.0–8.0)

## 2012-04-09 LAB — BASIC METABOLIC PANEL
CO2: 27 mEq/L (ref 19–32)
Chloride: 100 mEq/L (ref 96–112)
Creatinine, Ser: 1.13 mg/dL — ABNORMAL HIGH (ref 0.50–1.10)
Glucose, Bld: 146 mg/dL — ABNORMAL HIGH (ref 70–99)

## 2012-04-09 MED ORDER — AZITHROMYCIN 250 MG PO TABS: 250.0000 mg | ORAL_TABLET | Freq: Every day | ORAL | Status: DC

## 2012-04-09 MED ORDER — AZITHROMYCIN 250 MG PO TABS
500.0000 mg | ORAL_TABLET | Freq: Once | ORAL | Status: AC
  Administered 2012-04-09: 500 mg via ORAL
  Filled 2012-04-09: qty 2

## 2012-04-09 NOTE — ED Provider Notes (Signed)
4098 Assumed care/disposition of patient. Patient presented with confusion and change in behavior tonight. Per daughter acts like this when she has an infection.Awating labs. 1191 Labs unremarkable. Chest xray with atelectasis vs an early infiltrate. Will begin treatment with zithromax. Reviewed results with daughter. Will discharge home.     Results for orders placed during the hospital encounter of 04/09/12  CBC WITH DIFFERENTIAL      Component Value Range   WBC 6.4  4.0 - 10.5 K/uL   RBC 4.41  3.87 - 5.11 MIL/uL   Hemoglobin 12.6  12.0 - 15.0 g/dL   HCT 47.8  29.5 - 62.1 %   MCV 85.0  78.0 - 100.0 fL   MCH 28.6  26.0 - 34.0 pg   MCHC 33.6  30.0 - 36.0 g/dL   RDW 30.8  65.7 - 84.6 %   Platelets 264  150 - 400 K/uL   Neutrophils Relative 74  43 - 77 %   Neutro Abs 4.7  1.7 - 7.7 K/uL   Lymphocytes Relative 16  12 - 46 %   Lymphs Abs 1.0  0.7 - 4.0 K/uL   Monocytes Relative 9  3 - 12 %   Monocytes Absolute 0.6  0.1 - 1.0 K/uL   Eosinophils Relative 1  0 - 5 %   Eosinophils Absolute 0.1  0.0 - 0.7 K/uL   Basophils Relative 0  0 - 1 %   Basophils Absolute 0.0  0.0 - 0.1 K/uL  BASIC METABOLIC PANEL      Component Value Range   Sodium 139  135 - 145 mEq/L   Potassium 3.6  3.5 - 5.1 mEq/L   Chloride 100  96 - 112 mEq/L   CO2 27  19 - 32 mEq/L   Glucose, Bld 146 (*) 70 - 99 mg/dL   BUN 27 (*) 6 - 23 mg/dL   Creatinine, Ser 9.62 (*) 0.50 - 1.10 mg/dL   Calcium 95.2 (*) 8.4 - 10.5 mg/dL   GFR calc non Af Amer 39 (*) >90 mL/min   GFR calc Af Amer 46 (*) >90 mL/min  URINALYSIS, ROUTINE W REFLEX MICROSCOPIC      Component Value Range   Color, Urine YELLOW  YELLOW   APPearance CLEAR  CLEAR   Specific Gravity, Urine 1.015  1.005 - 1.030   pH 6.0  5.0 - 8.0   Glucose, UA NEGATIVE  NEGATIVE mg/dL   Hgb urine dipstick NEGATIVE  NEGATIVE   Bilirubin Urine NEGATIVE  NEGATIVE   Ketones, ur NEGATIVE  NEGATIVE mg/dL   Protein, ur NEGATIVE  NEGATIVE mg/dL   Urobilinogen, UA 0.2  0.0 - 1.0  mg/dL   Nitrite NEGATIVE  NEGATIVE   Leukocytes, UA NEGATIVE  NEGATIVE    Nicoletta Dress. Colon Branch, MD 04/09/12 8413

## 2012-04-09 NOTE — ED Notes (Signed)
Pt from home where family report pt had outburst, broke a door, ? Hallucinating about seeing people.  Per family, pt has had same symptoms before with uti

## 2012-04-09 NOTE — ED Provider Notes (Signed)
Medical screening examination/treatment/procedure(s) were performed by non-physician practitioner and as supervising physician I was immediately available for consultation/collaboration.  Nicoletta Dress. Colon Branch, MD 04/09/12 2332

## 2012-04-09 NOTE — ED Provider Notes (Signed)
History     CSN: [id number]  Arrival date & time 04/09/12  0014   First MD Initiated Contact with Patient 04/09/12 0012      Chief Complaint  Patient presents with  . Agitation    (Consider location/radiation/quality/duration/timing/severity/associated sxs/prior treatment) HPI Comments: EMS brought pt from  home.   Family  reports the pt has been agitated today.  Daughter thought pt had gone to sleep.  She heard a lot of noise and came downstairs.  The pt had beat on the front storm so hard that she broke it.  Last time she acted this way she had a UTI.  Pt denies dysuria, hematuria, frequency or urgency.  No cough, n/v, diarrhea.  No reported fever or chills.  PCP is dr. Paulene Floor at [facility].  The history is provided by the patient. No language interpreter was used.    Past Medical History  Diagnosis Date  . Chronic pain   . Hypertension   . Breast cancer   . Arthritis   . Diabetes mellitus     Past Surgical History  Procedure Date  . Unknown   . Appendectomy   . Abdominal hysterectomy     partial    No family history on file.  History  Substance Use Topics  . Smoking status: Never Smoker   . Smokeless tobacco: Not on file  . Alcohol Use: No    OB History    Grav Para Term Preterm Abortions TAB SAB Ect Mult Living                  Review of Systems  Constitutional: Negative for fever and chills.  Respiratory: Negative for cough, chest tightness and shortness of breath.   Cardiovascular: Negative for chest pain.  Genitourinary: Negative for dysuria, urgency, frequency, hematuria, vaginal bleeding and vaginal discharge.  All other systems reviewed and are negative.    Allergies  Tylenol  Home Medications   Current Outpatient Rx  Name  Route  Sig  Dispense  Refill  . AMLODIPINE BESYLATE 5 MG PO TABS   Oral   Take 5 mg by mouth every morning.         Marland Kitchen HYDROCHLOROTHIAZIDE 25 MG PO TABS   Oral   Take 25 mg by mouth  every morning.          Marland Kitchen HYDROCODONE-ACETAMINOPHEN 5-500 MG PO TABS   Oral   Take 0.5 tablets by mouth every 6 (six) hours as needed. FOR PAIN         . INSULIN GLARGINE 100 UNIT/ML [place] SOLN   Subcutaneous   Inject 10 Units into the skin every morning.         Marland Kitchen LETROZOLE 2.5 MG PO TABS   Oral   Take 2.5 mg by mouth every morning.          Marland Kitchen LORAZEPAM 0.5 MG PO TABS   Oral   Take 0.25 mg by mouth 2 (two) times daily as needed. FOR ANXIETY         . POTASSIUM CHLORIDE CRYS ER 10 MEQ PO TBCR   Oral   Take 10 mEq by mouth every morning.         . AZITHROMYCIN 250 MG PO TABS   Oral   Take 1 tablet (250 mg total) by mouth daily.   4 tablet   0     BP 141/61  Pulse 88  Temp 98.2 F (36.8 C) (Oral)  Resp  24  SpO2 96%  Physical Exam  Nursing note and vitals reviewed. Constitutional: She is oriented to person, place, and time. She appears well-developed and well-nourished. She is cooperative.  Non-toxic appearance. She does not have a sickly appearance. She does not appear ill. No distress.  HENT:  Head: Normocephalic and atraumatic.  Eyes: EOM are normal.  Neck: Normal range of motion.  Cardiovascular: Normal rate, regular rhythm and normal heart sounds.   Pulmonary/Chest: Breath sounds normal. Tachypnea noted. No respiratory distress. She has no decreased breath sounds. She has no wheezes. She has no rhonchi. She has no rales. She exhibits no tenderness.  Abdominal: Soft. She exhibits no distension and no mass. There is no hepatosplenomegaly. There is no tenderness. There is no rebound, no guarding and no CVA tenderness.  Musculoskeletal: Normal range of motion.  Neurological: She is alert and oriented to person, place, and time.  Skin: Skin is warm and dry.  Psychiatric: She has a normal mood and affect. Her speech is normal. Judgment normal. She is not agitated, not aggressive, is not hyperactive, not slowed, not withdrawn, not actively hallucinating and not  combative. She is attentive.    ED Course  Procedures (including critical care time)  Labs Reviewed  BASIC METABOLIC PANEL - Abnormal; Notable for the following:    Glucose, Bld 146 (*)     BUN 27 (*)     Creatinine, Ser 1.13 (*)     Calcium 10.6 (*)     GFR calc non Af Amer 39 (*)     GFR calc Af Amer 46 (*)     All other components within normal limits  CBC WITH DIFFERENTIAL  URINALYSIS, ROUTINE W REFLEX MICROSCOPIC  LAB REPORT - SCANNED   Dg Chest 2 View  04/09/2012  *RADIOLOGY REPORT*  Clinical Data: Agitation; hallucinations.  CHEST - 2 VIEW  Comparison: Chest radiograph performed 02/15/2012  Findings: The lungs are well-aerated.  Mild opacity at the left lung base may reflect atelectasis or possibly pneumonia.  There is no evidence of pleural effusion or pneumothorax.  The heart is normal in size; calcification is noted in the aortic arch.  No acute osseous abnormalities are seen.  IMPRESSION: Mild opacity at the left lung base may reflect atelectasis or possibly pneumonia.   Original Report Authenticated By: Tonia Ghent, M.D.    0110-pt and her daughter are sitting in the room.  The pt appears to be comfortable.  i have discussed the pt  Presentation and w/u with dr. Colon Branch who is assuming pt care.  1. Confusion   2. Upper respiratory infection       MDM          Evalina Field, PA 04/09/12 2329

## 2012-11-22 ENCOUNTER — Other Ambulatory Visit: Payer: Self-pay

## 2012-11-22 MED ORDER — LORAZEPAM 0.5 MG PO TABS: 0.2500 mg | ORAL_TABLET | Freq: Two times a day (BID) | ORAL | Status: DC | PRN

## 2012-11-22 NOTE — Telephone Encounter (Signed)
Last seen 06/04/12   If approved call in and have nurse notify patient

## 2012-11-22 NOTE — Telephone Encounter (Signed)
Called to pharmacy 

## 2012-11-22 NOTE — Telephone Encounter (Signed)
Last seen 06/04/12.

## 2012-11-22 NOTE — Telephone Encounter (Signed)
Oops rx is ativan not xanax

## 2012-11-22 NOTE — Telephone Encounter (Signed)
NTBS - please call in rx for xanax 0 refills

## 2012-11-30 ENCOUNTER — Other Ambulatory Visit: Payer: Self-pay | Admitting: Nurse Practitioner

## 2012-12-03 MED ORDER — HYDROCODONE-ACETAMINOPHEN 5-500 MG PO TABS: 0.5000 | ORAL_TABLET | Freq: Four times a day (QID) | ORAL | Status: DC | PRN

## 2012-12-03 NOTE — Telephone Encounter (Signed)
Last seen 05/13 except foe one sick visit, I cant see where we have filled this recently? Sent chart back

## 2012-12-03 NOTE — Telephone Encounter (Signed)
rx ready for pickup 

## 2013-01-14 ENCOUNTER — Emergency Department (HOSPITAL_COMMUNITY): Payer: Medicare Other

## 2013-01-14 ENCOUNTER — Emergency Department (HOSPITAL_COMMUNITY)
Admission: EM | Admit: 2013-01-14 | Discharge: 2013-01-15 | Disposition: A | Discharge status: Home / Self Care | Payer: Medicare Other | Attending: Emergency Medicine | Admitting: Emergency Medicine

## 2013-01-14 ENCOUNTER — Encounter (HOSPITAL_COMMUNITY): Payer: Self-pay | Admitting: *Deleted

## 2013-01-14 DIAGNOSIS — F29 Unspecified psychosis not due to a substance or known physiological condition: Secondary | ICD-10-CM

## 2013-01-14 DIAGNOSIS — H53149 Visual discomfort, unspecified: Secondary | ICD-10-CM | POA: Insufficient documentation

## 2013-01-14 DIAGNOSIS — Z794 Long term (current) use of insulin: Secondary | ICD-10-CM | POA: Insufficient documentation

## 2013-01-14 DIAGNOSIS — Z79899 Other long term (current) drug therapy: Secondary | ICD-10-CM | POA: Insufficient documentation

## 2013-01-14 DIAGNOSIS — I10 Essential (primary) hypertension: Secondary | ICD-10-CM | POA: Insufficient documentation

## 2013-01-14 DIAGNOSIS — Z8739 Personal history of other diseases of the musculoskeletal system and connective tissue: Secondary | ICD-10-CM | POA: Insufficient documentation

## 2013-01-14 DIAGNOSIS — R441 Visual hallucinations: Secondary | ICD-10-CM

## 2013-01-14 DIAGNOSIS — F0391 Unspecified dementia with behavioral disturbance: Secondary | ICD-10-CM | POA: Insufficient documentation

## 2013-01-14 DIAGNOSIS — G479 Sleep disorder, unspecified: Secondary | ICD-10-CM | POA: Insufficient documentation

## 2013-01-14 DIAGNOSIS — R609 Edema, unspecified: Secondary | ICD-10-CM | POA: Insufficient documentation

## 2013-01-14 DIAGNOSIS — M79609 Pain in unspecified limb: Secondary | ICD-10-CM | POA: Insufficient documentation

## 2013-01-14 DIAGNOSIS — F0281 Dementia in other diseases classified elsewhere with behavioral disturbance: Secondary | ICD-10-CM

## 2013-01-14 DIAGNOSIS — G8929 Other chronic pain: Secondary | ICD-10-CM | POA: Insufficient documentation

## 2013-01-14 DIAGNOSIS — R32 Unspecified urinary incontinence: Secondary | ICD-10-CM | POA: Insufficient documentation

## 2013-01-14 DIAGNOSIS — E119 Type 2 diabetes mellitus without complications: Secondary | ICD-10-CM | POA: Insufficient documentation

## 2013-01-14 DIAGNOSIS — F03918 Unspecified dementia, unspecified severity, with other behavioral disturbance: Secondary | ICD-10-CM | POA: Insufficient documentation

## 2013-01-14 DIAGNOSIS — Z853 Personal history of malignant neoplasm of breast: Secondary | ICD-10-CM | POA: Insufficient documentation

## 2013-01-14 DIAGNOSIS — C50919 Malignant neoplasm of unspecified site of unspecified female breast: Secondary | ICD-10-CM

## 2013-01-14 DIAGNOSIS — R413 Other amnesia: Secondary | ICD-10-CM | POA: Insufficient documentation

## 2013-01-14 HISTORY — DX: Malignant neoplasm of unspecified site of unspecified female breast: C50.919

## 2013-01-14 LAB — CBC WITH DIFFERENTIAL/PLATELET
Basophils Absolute: 0 10*3/uL (ref 0.0–0.1)
Basophils Relative: 0 % (ref 0–1)
Eosinophils Relative: 0 % (ref 0–5)
HCT: 37.7 % (ref 36.0–46.0)
MCHC: 34 g/dL (ref 30.0–36.0)
Monocytes Absolute: 0.8 10*3/uL (ref 0.1–1.0)
Neutro Abs: 7.6 10*3/uL (ref 1.7–7.7)
RDW: 14.5 % (ref 11.5–15.5)

## 2013-01-14 LAB — URINALYSIS, ROUTINE W REFLEX MICROSCOPIC
Glucose, UA: NEGATIVE mg/dL
Hgb urine dipstick: NEGATIVE
Specific Gravity, Urine: 1.026 (ref 1.005–1.030)
pH: 5 (ref 5.0–8.0)

## 2013-01-14 LAB — COMPREHENSIVE METABOLIC PANEL
AST: 18 U/L (ref 0–37)
Albumin: 3.8 g/dL (ref 3.5–5.2)
Calcium: 10.1 mg/dL (ref 8.4–10.5)
Chloride: 97 mEq/L (ref 96–112)
Creatinine, Ser: 1.96 mg/dL — ABNORMAL HIGH (ref 0.50–1.10)
Total Protein: 7.7 g/dL (ref 6.0–8.3)

## 2013-01-14 MED ORDER — LETROZOLE 2.5 MG PO TABS
2.5000 mg | ORAL_TABLET | Freq: Every morning | ORAL | Status: DC
  Filled 2013-01-14 (×2): qty 1

## 2013-01-14 MED ORDER — HYDROCHLOROTHIAZIDE 25 MG PO TABS
25.0000 mg | ORAL_TABLET | Freq: Every morning | ORAL | Status: DC
Start: 2013-01-15 — End: 2013-01-15
  Filled 2013-01-14: qty 1

## 2013-01-14 MED ORDER — AMLODIPINE BESYLATE 5 MG PO TABS
5.0000 mg | ORAL_TABLET | Freq: Every morning | ORAL | Status: DC
  Filled 2013-01-14: qty 1

## 2013-01-14 MED ORDER — INSULIN GLARGINE 100 UNIT/ML ~~LOC~~ SOLN
10.0000 [IU] | Freq: Every morning | SUBCUTANEOUS | Status: DC
  Administered 2013-01-15: 10 [IU] via SUBCUTANEOUS
  Filled 2013-01-14: qty 0.1

## 2013-01-14 MED ORDER — OXYCODONE-ACETAMINOPHEN 5-325 MG PO TABS
1.0000 | ORAL_TABLET | Freq: Once | ORAL | Status: AC
  Administered 2013-01-14: 1 via ORAL
  Filled 2013-01-14: qty 1

## 2013-01-14 MED ORDER — SODIUM CHLORIDE 0.9 % IV BOLUS (SEPSIS)
500.0000 mL | Freq: Once | INTRAVENOUS | Status: AC
  Administered 2013-01-14: 500 mL via INTRAVENOUS

## 2013-01-14 MED ORDER — HALOPERIDOL 1 MG PO TABS
0.5000 mg | ORAL_TABLET | Freq: Two times a day (BID) | ORAL | Status: DC
  Administered 2013-01-14 (×2): 0.5 mg via ORAL
  Filled 2013-01-14 (×3): qty 1

## 2013-01-14 NOTE — Progress Notes (Signed)
[facility] ED CM consulted by ED [facility] NP about [facility] for disease management and medication administration for the pt CM reviewed EPIC chart information and spoke with Daughter Burna Mortimer at bedside. Cm reviewed [facility] RNs services available Burna Mortimer stated pt [facility] had [facility]. Wanda's choice is [facility] (including standard HHRN not [facility]) for services CM spoke with Baxter Hire, [facility] in house coordinator assist with referral and EDP, [place] for pending orders  Provided Burna Mortimer with home health choices for [place]  MEDICARE-CERTIFIED HOME HEALTH AGENCIES [facility]   Agencies that are Medicare-Certified and affiliated with [facility]  Home Health Agency  Telephone Number Address  [facility]  [facility] has ownership interest in this company; however, you are under no obligation to use this agency. [phone]  [address] [place], [place] [address] [email or web address]    Agencies that are Medicare-Certified and are not affiliated with [facility] Agency Telephone Number Address  Aldine Contes [phone] Fax [phone] [address] [place], [place]  [address] [email or web address]  [facility] [phone] [address] [place], [place] [address] [email or web address]  [facility]  [phone] Fax [phone] [address]  [place], [place]  [address] [email or web address]  [facility] [phone] or [phone] [address] [place], [place] [address]  [facility] [phone] or [phone] Fax number [phone] [place], [place]  [address] [email or web address]

## 2013-01-14 NOTE — Progress Notes (Addendum)
Patient ID: Katherine Meyer, female   DOB: 30-Jul-1912, 79 y.o.   MRN: [id number] Attempted evaluation this am with Dr Lolly Mustache present on this patient.  She is confused and disoriented.  She was not able to recognize her daughter she is living with.  We were unable to obtain any information from patient who is pleasant and smiling but remains confused. Collateral information from daughter:  Daughter reports her mother has been dealing with memory loss, seeing and hearing people are not around her for about 2 months.  She also reports her mother have not been sleeping or eating for same period but her condition got worse this weekend.  She stated her mother have had issues with UTI and was treated in the past.  She states she and her husband live with her mother and gradually she she does not recognize her anymore.   Daughter does not want placement of her mother to any facility but will like [place] to stabilize and keep her on medications for psychosis and she will be taking her home.   MSE: Frail looking, well groomed and thin female.  She is confused and disoriented but cooperative and calm.  At this time we cannot assess her further due to the acuity of her illness. Diagnosis:  Psychosis NOS, R/O UTI. Plan: We will start Haldol  .5 mg po bid. We will reevaluate tomorrow. Katherine Meyer  PMHNP-BC  I have personally seen the patient and agreed with the findings and involved in the treatment plan. Katherine Sharper, MD  8/12/20014 Psychiatry note.  Katherine Meyer has been calm. Does not answer any questions, looks to see who it is apparently talking to her then closes her eyes and says nothing.  Her daughter wants her at home so will call her to see if she is comfortable having her discharged today.  After checking with her daughter  She says her mother  Has chronic pain which seems to keep her up at night but with the Percocet she slept better last night but still woke up last night and was threatening staff.   Consequently,we will hold her overnight and see how she does for another day.

## 2013-01-14 NOTE — ED Notes (Signed)
Per the pts daughter the pt has become very confused over the last 2 days. Has been having visual hallucinations and talking to dead relatives and friends. She is also complaining of bilateral leg pain.

## 2013-01-14 NOTE — ED Provider Notes (Addendum)
CSN: [id number]     Arrival date & time 01/14/13  0735 History     First MD Initiated Contact with Patient 01/14/13 [phone]     Chief Complaint  Patient presents with  . Leg Pain  . Altered Mental Status   (Consider location/radiation/quality/duration/timing/severity/associated sxs/prior Treatment) HPI Comments: Pt lives with daughter who takes care of her and states taht she always hallucinates.  Seeing old men that she ask her to get out of there and seeing people in the front yard. However she states every month or 2 she will have an episode where she does not sleep at night in her hallucinations are worse however this has been going on for the last 2 days now and she has never been this bad.  She was taken her to her doctor in the past but states they don't seem to care and she's never been diagnosed or treated for dementia. Last month they did give her Ativan for anxiety but daughter states it's not helping.  Patient is a 79 y.o. female presenting with altered mental status. The history is provided by a caregiver and a relative.  Altered Mental Status Presenting symptoms: behavior changes, confusion, disorientation and memory loss   Severity:  Severe Most recent episode:  Today (started 2 days ago and has not slept at night for 2 nights and is hallucinating . but last night also became combative for the first time) Episode history:  Continuous Duration:  2 days Timing:  Sporadic Progression:  Worsening Chronicity:  Recurrent Context: dementia   Context: not head injury, taking medications as prescribed, not a nursing home resident, not a recent change in medication, not a recent illness and not a recent infection   Associated symptoms: bladder incontinence and hallucinations   Associated symptoms: no abdominal pain, normal movement, no decreased appetite, no difficulty breathing, no fever, no headaches, no suicidal behavior, no vomiting and no weakness     Past Medical History   Diagnosis Date  . Chronic pain   . Hypertension   . Breast cancer   . Arthritis   . Diabetes mellitus   . Breast cancer 01/14/2013   Past Surgical History  Procedure Laterality Date  . Unknown    . Appendectomy    . Abdominal hysterectomy      partial   History reviewed. No pertinent family history. History  Substance Use Topics  . Smoking status: Never Smoker   . Smokeless tobacco: Not on file  . Alcohol Use: No   OB History   Grav Para Term Preterm Abortions TAB SAB Ect Mult Living                 Review of Systems  Constitutional: Negative for fever and decreased appetite.  Gastrointestinal: Negative for vomiting and abdominal pain.  Genitourinary: Positive for bladder incontinence.  Musculoskeletal:       Chronic leg pain and since not sleeping for the last 2 days has been having worsening pain.  Neurological: Negative for weakness and headaches.  Psychiatric/Behavioral: Positive for hallucinations, memory loss, confusion, sleep disturbance and altered mental status.  All other systems reviewed and are negative.    Allergies  Tylenol  Home Medications   Current Outpatient Rx  Name  Route  Sig  Dispense  Refill  . amLODipine (NORVASC) 5 MG tablet   Oral   Take 5 mg by mouth every morning.         Marland Kitchen azithromycin (ZITHROMAX Z-PAK) 250 MG tablet  Oral   Take 1 tablet (250 mg total) by mouth daily.   4 tablet   0   . hydrochlorothiazide (HYDRODIURIL) 25 MG tablet   Oral   Take 25 mg by mouth every morning.          Marland Kitchen HYDROcodone-acetaminophen (VICODIN) 5-500 MG per tablet   Oral   Take 0.5 tablets by mouth every 6 (six) hours as needed. FOR PAIN   30 tablet   0   . insulin glargine (LANTUS) 100 UNIT/ML injection   Subcutaneous   Inject 10 Units into the skin every morning.         Marland Kitchen letrozole (FEMARA) 2.5 MG tablet   Oral   Take 2.5 mg by mouth every morning.          Marland Kitchen LORazepam (ATIVAN) 0.5 MG tablet   Oral   Take 0.5 tablets  (0.25 mg total) by mouth 2 (two) times daily as needed. FOR ANXIETY   60 tablet   0   . potassium chloride (K-DUR,KLOR-CON) 10 MEQ tablet   Oral   Take 10 mEq by mouth every morning.          BP 148/65  Pulse 92  Temp(Src) 97.7 F (36.5 C) (Oral)  Resp 16  SpO2 100% Physical Exam  Nursing note and vitals reviewed. Constitutional: She appears well-developed and well-nourished. No distress.  HENT:  Head: Normocephalic and atraumatic.  Mouth/Throat: Oropharynx is clear and moist.  Eyes: Conjunctivae and EOM are normal. Pupils are equal, round, and reactive to light.  Neck: Normal range of motion. Neck supple.  Cardiovascular: Normal rate, regular rhythm and intact distal pulses.   No murmur heard. Pulmonary/Chest: Effort normal and breath sounds normal. No respiratory distress. She has no wheezes. She has no rales.  Abdominal: Soft. She exhibits no distension. There is no tenderness. There is no rebound and no guarding.  Musculoskeletal: Normal range of motion. She exhibits edema. She exhibits no tenderness.  Pain with palpation and ranging the bilateral hips and knees.  Left hip slightly more painful than right.  1+ edema in bilateral lower ext  Neurological: She is alert.  Pt is awake and alert and is able to answer questions but not oriented to time.  Skin: Skin is warm and dry. No rash noted. No erythema.  Psychiatric: She has a normal mood and affect. Her behavior is normal.  Pt speaks very little here but does call her daughter her mother and daughter states there are days when she does not know who she is at all    ED Course   Procedures (including critical care time)  Labs Reviewed  CBC WITH DIFFERENTIAL - Abnormal; Notable for the following:    Neutrophils Relative % 81 (*)    Lymphocytes Relative 11 (*)    All other components within normal limits  COMPREHENSIVE METABOLIC PANEL - Abnormal; Notable for the following:    Glucose, Bld 226 (*)    BUN 43 (*)     Creatinine, Ser 1.96 (*)    GFR calc non Af Amer 20 (*)    GFR calc Af Amer 23 (*)    All other components within normal limits  URINALYSIS, ROUTINE W REFLEX MICROSCOPIC - Abnormal; Notable for the following:    APPearance CLOUDY (*)    Bilirubin Urine SMALL (*)    All other components within normal limits   Dg Chest 2 View  01/14/2013   *RADIOLOGY REPORT*  Clinical Data: Altered mental status  CHEST - 2 VIEW  Comparison: 04/09/2012  Findings: The heart, mediastinum and hila are unremarkable.  The lungs are mildly hyperexpanded.  The lungs are clear.  No pleural effusion or pneumothorax.  The bony thorax is demineralized but intact.  IMPRESSION: No acute cardiopulmonary disease.   Original Report Authenticated By: Amie Portland, M.D.   Ct Head Wo Contrast  01/14/2013   *RADIOLOGY REPORT*  Clinical Data: Altered mental status  CT HEAD WITHOUT CONTRAST  Technique:  Contiguous axial images were obtained from the base of the skull through the vertex without contrast.  Comparison: Prior head CT 02/15/2012  Findings: No acute intracranial hemorrhage, acute infarction, mass lesion, mass effect, midline shift or hydrocephalus.  Gray-white differentiation is preserved throughout.  The tip of the stable pattern of cerebral and cerebellar volume loss and periventricular, subcortical and deep white matter hypoattenuation.  No focal soft tissue L or calvarial abnormality.  Normal aeration of the mastoid air cells and visualized paranasal sinuses.  Atherosclerotic calcifications noted in the bilateral cavernous carotid arteries.  IMPRESSION:  1.  No acute intracranial abnormality. 2.  Stable atrophy and chronic microvascular ischemic white matter changes.   Original Report Authenticated By: Malachy Moan, M.D.     Date: 01/14/2013  Rate: 84  Rhythm: normal sinus rhythm  QRS Axis: normal  Intervals: normal  ST/T Wave abnormalities: nonspecific ST/T changes  Conduction Disutrbances:none  Narrative  Interpretation:   Old EKG Reviewed: unchanged   1. Psychosis   2. 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 Visual hallucination     MDM  Patient being brought in by daughter do to worsening hallucinations, not sleeping at night and last night she was combative. Speaking with her daughter it sounds like patient for some time now has had visual hallucinations and untreated dementia that waxes and wanes. These episodes are not associated with infections her medication changes. Patient did recently start on Ativan approximately one month ago which daughter states is not changing her behavior. When asked her if she has consulted her doctor she states that they don't seem to care or address these needs due to patient's age.  On exam today patient is calm and cooperative she is awake and alert but is confused and keeps calling her daughter her mother. She has generalized lower leg pain which appears to be from arthritis there is no signs concerning for DVT, cellulitis and she has strong distal pulses.  Daughter denies persistent cough, shortness of breath or patient complaining of chest pain. Patient is incontinent and wears a diaper and is mostly wheelchair-bound. Will evaluate for cause patient behavior with CBC, CMP, chest x-ray, head CT, EKG.  If normal will get psych eval.  9:12 AM Images wnl.  EKG wnl.  Labs wnl except for worsening creatinine of 1.96 today from 1.13 about 9 months ago without others to compare between then.  Not on an ace inhibitor or NSAIDs.  UA neg for infection.  Feel that pt's sx today are due to uncontrolled dementia with insomnia.  Discussed with ACT and will have psychiatrist see her for medication recommendation and evaluation. Discussed with daughter outpt evaluation for renal function and pt has normal K and no acute signs of renal failure.  11:21 AM Pt seen by psych and recommended haldol .5mg  bid.  Request that she stay overnight to ensure  tolerating meds and if ok then d/c home with daughter tomorrow.  Daughter does not desire placement for her mother or Sandre Kitty  at this time.  Gwyneth Sprout, MD 01/14/13 1121  Gwyneth Sprout, MD 01/14/13 1122

## 2013-01-14 NOTE — ED Notes (Signed)
MD at bedside. 

## 2013-01-15 LAB — GLUCOSE, CAPILLARY: Glucose-Capillary: 103 mg/dL — ABNORMAL HIGH (ref 70–99)

## 2013-01-15 MED ORDER — HALOPERIDOL 0.5 MG PO TABS: 0.5000 mg | ORAL_TABLET | Freq: Two times a day (BID) | ORAL | Status: AC

## 2013-01-15 MED ORDER — LORAZEPAM 2 MG/ML IJ SOLN
INTRAMUSCULAR | Status: AC
  Filled 2013-01-15: qty 1

## 2013-01-15 MED ORDER — LORAZEPAM 2 MG/ML IJ SOLN
1.0000 mg | Freq: Once | INTRAMUSCULAR | Status: AC
  Administered 2013-01-15: 1 mg via INTRAVENOUS

## 2013-01-15 NOTE — ED Notes (Signed)
[name] is supposed to be here at 4pm to pick up her mother. Pt is dressed and ready to go upon daughter's arrival.

## 2013-01-15 NOTE — ED Notes (Signed)
Patient is shouting out and when touched is trying to hit the nurses and staff. The patient is very upset and screaming. MD aware and orders carried out.

## 2013-01-15 NOTE — ED Notes (Signed)
Family at bedside at this time. Pt still not cooperating and taking her medicine. Dr. Effie Shy made aware that patient has not had her morning dose of medicine, Haldol included. Pt is still ok for d/c at this time with resources for follow up and an upcoming appointment.

## 2013-01-15 NOTE — ED Provider Notes (Signed)
Evaluation for discharge. Patient remains calm, comfortable. She's been sleeping a lot. She's not been eating much here. She's been evaluated by psychiatry, who feel that she is stable for discharge from psychiatric standpoint. She appears medically stable. Plan is to continue Haldol at home, and followup with PCP, in psychiatry, for ongoing management.  Flint Melter, MD 01/15/13 1600

## 2013-01-15 NOTE — ED Notes (Signed)
Pt awake and agitated at this time. Upon checking in on pt with NT, pt was awake and sitting on the edge of the bed. Pt is confused and yelling that she needs for [place] to lock the door. Pt was reoriented and offered a snack. Pt refused food at this time.

## 2013-01-15 NOTE — Progress Notes (Addendum)
Pt discuss in LOS, patient recommended for discharge home on outpatient follow up. CSW spok eiwth psychiatric team who is in agreemnt of plan. CSW spoke with pt daughter, who is agreeable with plan. Pt daughter requesting assistance with psychiatric follow up. Pt scheduled for appointment at [facility] health outpatient with Dr. Lolly Mustache on Thursday 8/14 at 1:15pm, arrive by 12 or 12:15pm. Patient daughter plans to pick up patient at 4pm. Pt daughter stated she was not sure if she could have patient to appointment on Thursday but will call to reschedule if need be. EDP aware. Pscyhiatrist to write RX. RN aware.   Catha Gosselin, LCSW [phone]  ED CSW .01/15/2013 11:23am

## 2013-01-15 NOTE — BHH Counselor (Signed)
Writer spoke to the daughter at work [phone]) and informed her of the patients progress.  Writer was informed by the daughter that the patient normally experiences sundowning around 2:00 p.m.  Writer informed the daughter that the Psychiatrist is going to keep the patient overnight to ensure that the medication is addressing the symptoms of psychosis.

## 2013-01-17 ENCOUNTER — Ambulatory Visit (HOSPITAL_COMMUNITY): Payer: Medicare Other | Admitting: Psychiatry

## 2013-04-03 ENCOUNTER — Other Ambulatory Visit: Payer: Self-pay | Admitting: *Deleted

## 2013-04-03 MED ORDER — LORAZEPAM 0.5 MG PO TABS: 0.5000 mg | ORAL_TABLET | Freq: Two times a day (BID) | ORAL | Status: DC | PRN

## 2013-04-03 MED ORDER — AMLODIPINE BESYLATE 5 MG PO TABS: 5.0000 mg | ORAL_TABLET | Freq: Every morning | ORAL | Status: DC

## 2013-04-03 MED ORDER — HYDROCHLOROTHIAZIDE 25 MG PO TABS: 25.0000 mg | ORAL_TABLET | Freq: Every morning | ORAL | Status: DC

## 2013-04-03 NOTE — Telephone Encounter (Signed)
Please call in ativan rx 

## 2013-04-03 NOTE — Telephone Encounter (Signed)
No labs here since 05/13, last seen by [facility] 06/04/13. If lorazepam is approved route to pool, to be [facility]

## 2013-04-03 NOTE — Telephone Encounter (Signed)
NTBS.

## 2013-04-04 NOTE — Telephone Encounter (Signed)
rx called

## 2013-05-17 ENCOUNTER — Other Ambulatory Visit: Payer: Self-pay

## 2013-05-17 MED ORDER — LORAZEPAM 0.5 MG PO TABS: 0.5000 mg | ORAL_TABLET | Freq: Two times a day (BID) | ORAL | Status: DC | PRN

## 2013-05-17 MED ORDER — INSULIN GLARGINE 100 UNIT/ML ~~LOC~~ SOLN: 10.0000 [IU] | Freq: Every morning | SUBCUTANEOUS | Status: AC

## 2013-05-17 MED ORDER — HYDROCHLOROTHIAZIDE 25 MG PO TABS: 25.0000 mg | ORAL_TABLET | Freq: Every morning | ORAL | Status: DC

## 2013-05-17 MED ORDER — AMLODIPINE BESYLATE 5 MG PO TABS: 5.0000 mg | ORAL_TABLET | Freq: Every morning | ORAL | Status: DC

## 2013-05-17 NOTE — Telephone Encounter (Signed)
rx called into pharmacy and patients number is disconnected

## 2013-05-17 NOTE — Telephone Encounter (Signed)
Phone in ativan- no more meds without being seen

## 2013-05-17 NOTE — Telephone Encounter (Signed)
Last seen 06/04/12  [facility]   Last glucose 01/15/13  In hospital

## 2013-06-11 ENCOUNTER — Other Ambulatory Visit: Payer: Self-pay

## 2013-06-12 ENCOUNTER — Telehealth: Payer: Self-pay | Admitting: *Deleted

## 2013-06-12 NOTE — Telephone Encounter (Signed)
Wants refill on hydrocodone. Please advise.

## 2013-06-13 MED ORDER — HYDROCODONE-ACETAMINOPHEN 5-325 MG PO TABS: 1.0000 | ORAL_TABLET | Freq: Four times a day (QID) | ORAL | Status: DC | PRN

## 2013-06-13 NOTE — Telephone Encounter (Signed)
rx ready for pickup 

## 2013-07-17 ENCOUNTER — Other Ambulatory Visit: Payer: Self-pay | Admitting: *Deleted

## 2013-07-17 MED ORDER — HYDROCHLOROTHIAZIDE 25 MG PO TABS: 25.0000 mg | ORAL_TABLET | Freq: Every morning | ORAL | Status: AC

## 2013-07-17 MED ORDER — HYDROCODONE-ACETAMINOPHEN 5-325 MG PO TABS: 1.0000 | ORAL_TABLET | Freq: Four times a day (QID) | ORAL | Status: DC | PRN

## 2013-07-17 MED ORDER — LORAZEPAM 0.5 MG PO TABS: 0.5000 mg | ORAL_TABLET | Freq: Two times a day (BID) | ORAL | Status: AC | PRN

## 2013-07-17 MED ORDER — AMLODIPINE BESYLATE 5 MG PO TABS: 5.0000 mg | ORAL_TABLET | Freq: Every morning | ORAL | Status: AC

## 2013-07-17 NOTE — Telephone Encounter (Signed)
Patient last seen in office on 06-04-12. Please advise. If approved please print hydrocodone and route to Pool B so nurse can phone lorazepam to [facility] and call patient to pick up rx

## 2013-07-18 ENCOUNTER — Telehealth: Payer: Self-pay | Admitting: *Deleted

## 2013-07-18 NOTE — Telephone Encounter (Signed)
Aware, rx's ready. 

## 2013-08-18 ENCOUNTER — Other Ambulatory Visit: Payer: Self-pay | Admitting: Nurse Practitioner

## 2013-08-21 ENCOUNTER — Telehealth: Payer: Self-pay | Admitting: Nurse Practitioner

## 2013-08-21 MED ORDER — HYDROCODONE-ACETAMINOPHEN 5-325 MG PO TABS: 1.0000 | ORAL_TABLET | Freq: Four times a day (QID) | ORAL | Status: AC | PRN

## 2013-08-21 NOTE — Telephone Encounter (Signed)
rx ready for pickup 

## 2013-08-22 NOTE — Telephone Encounter (Signed)
Hydrocodone script ready.

## 2013-11-04 DEATH — deceased

## 2013-12-03 IMAGING — CT CT HEAD W/O CM
2 series · 17 of 30 positions shown, 20 images · non-contrast
Comparison: Prior head CT 02/15/2012

CLINICAL DATA: Altered mental status

CT HEAD WITHOUT CONTRAST
TECHNIQUE: Contiguous axial images were obtained from the base of
the skull through the vertex without contrast.

[Series 2: head w/o · axial · non-contrast · 0.48mm/px · z∈[-171,-56]mm · 9 of 29 slices shown, 12 images]
[im 3/29  brain]
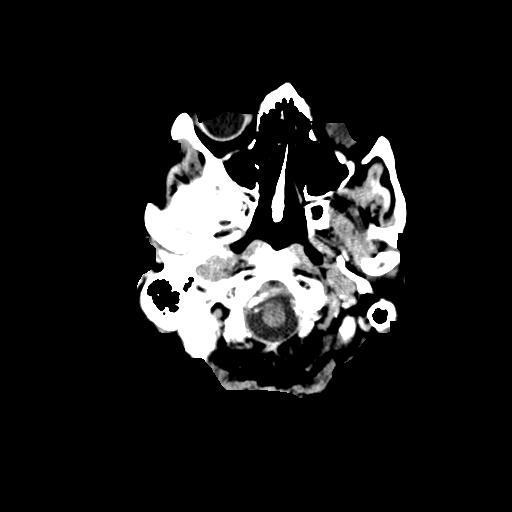
[im 3/29  bone]
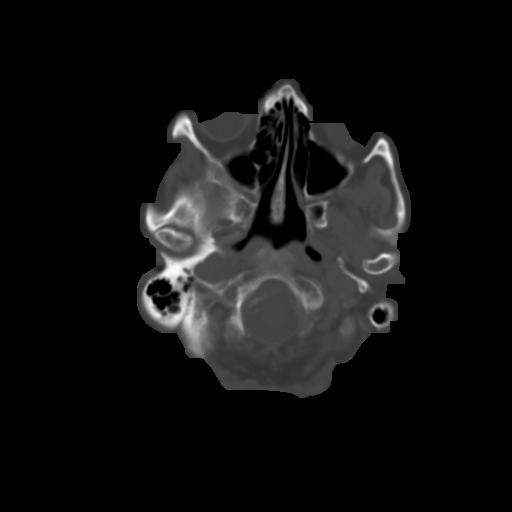
[im 6/29  brain]
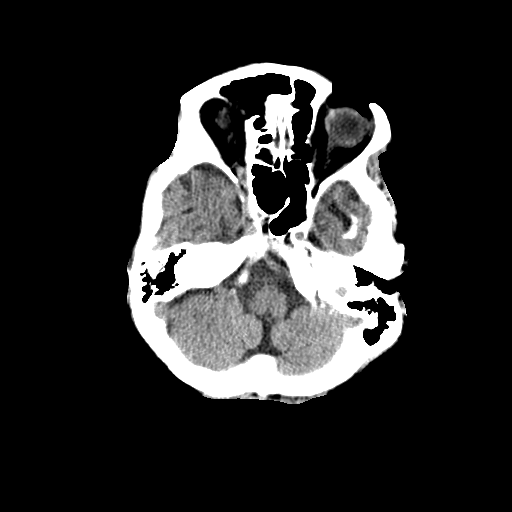
[im 9/29  brain]
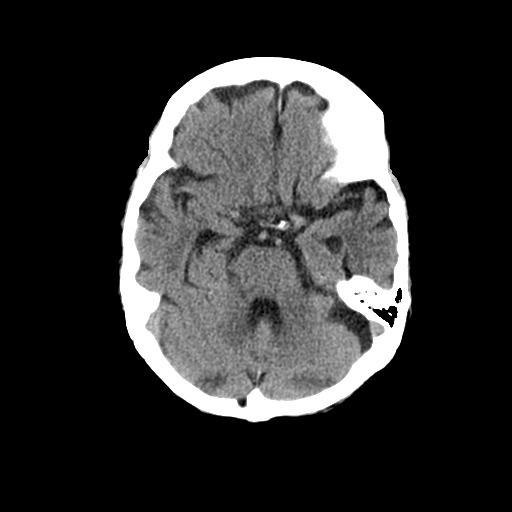
[im 12/29  brain]
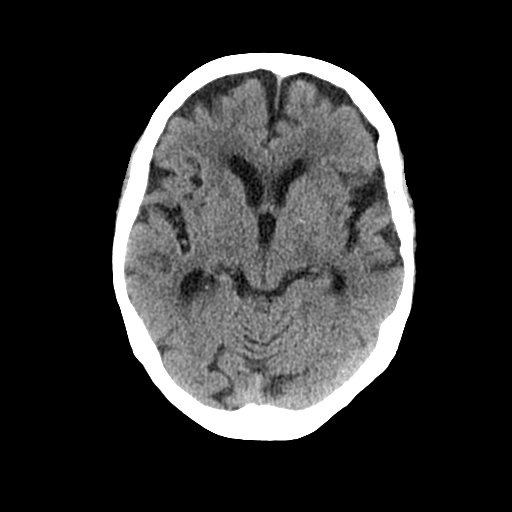
[im 15/29  brain]
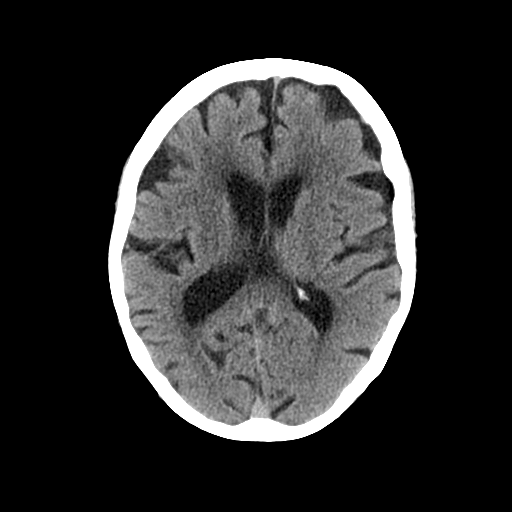
[im 15/29  bone]
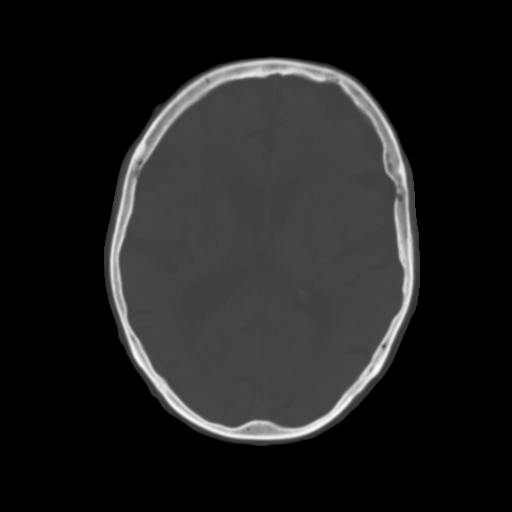
[im 17/29  brain]
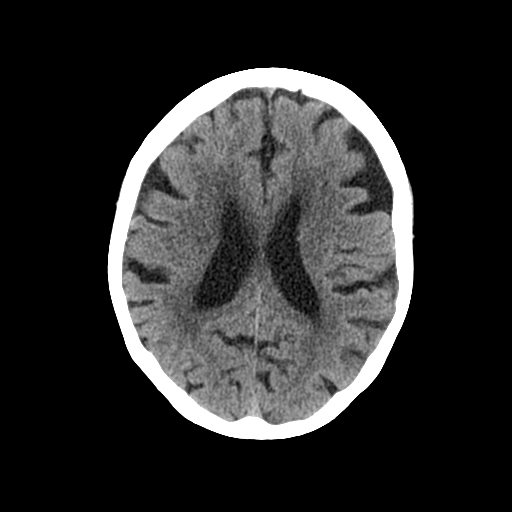
[im 20/29  brain]
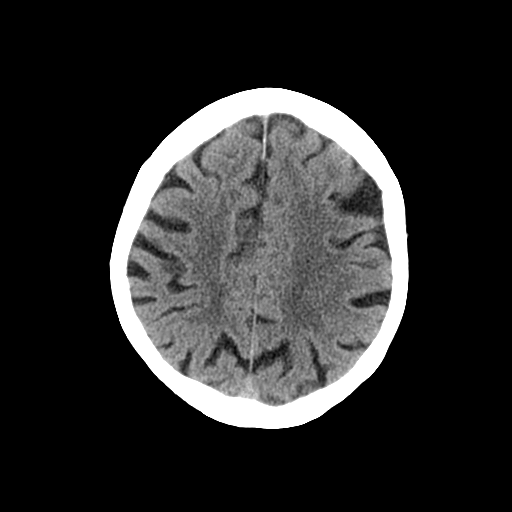
[im 23/29  brain]
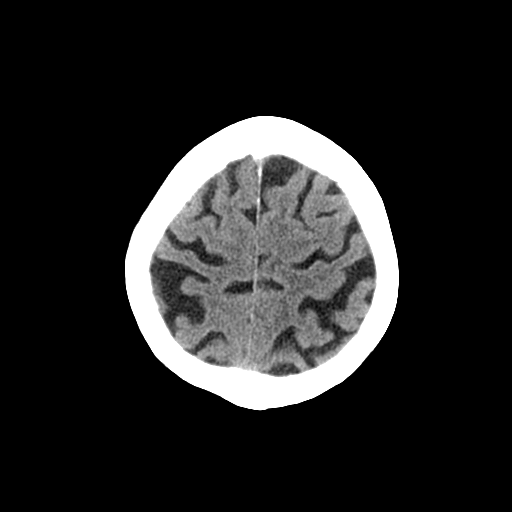
[im 26/29  brain]
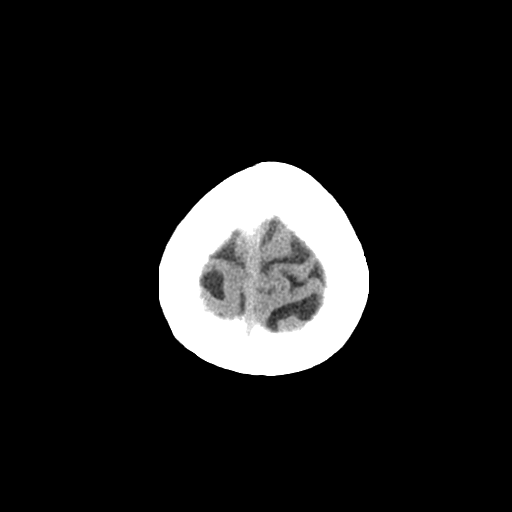
[im 26/29  bone]
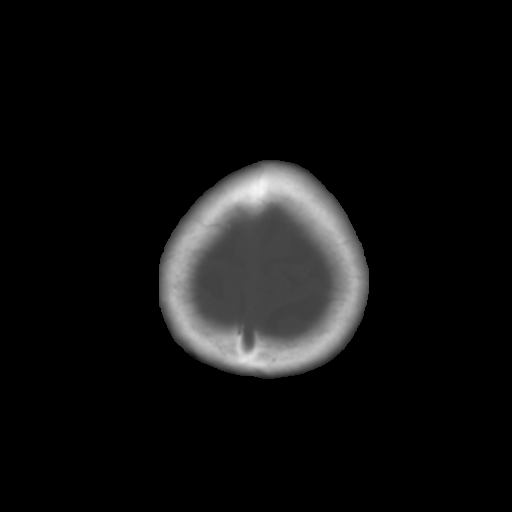

[Series 3: bone windows · axial · 0.48mm/px · z∈[-166,-61]mm · 8 of 47 slices shown]
[im 6/47  bone]
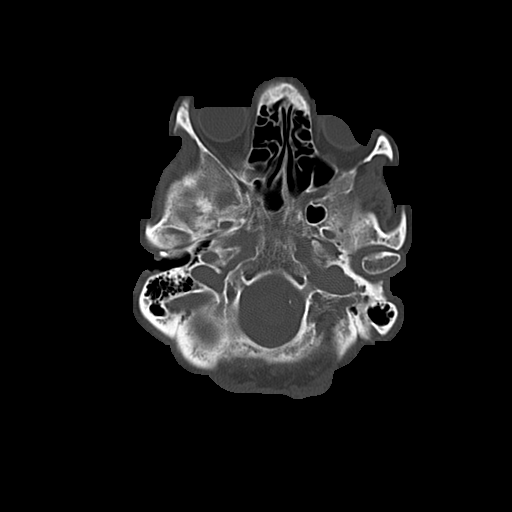
[im 11/47  bone]
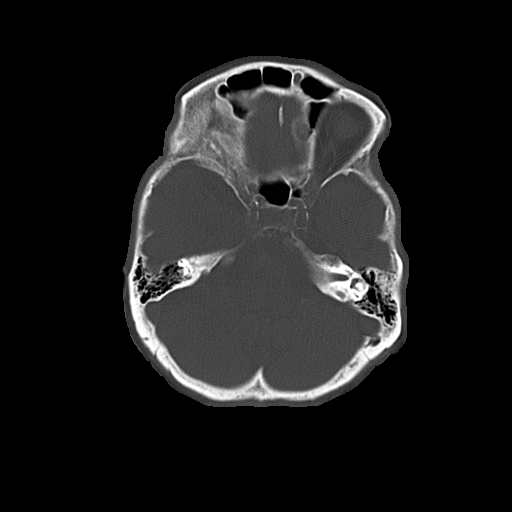
[im 16/47  bone]
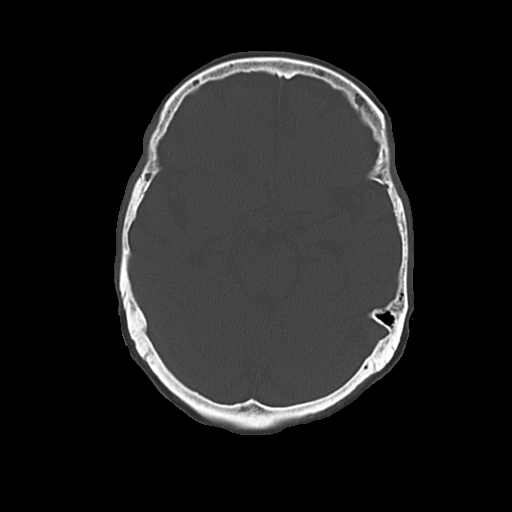
[im 21/47  bone]
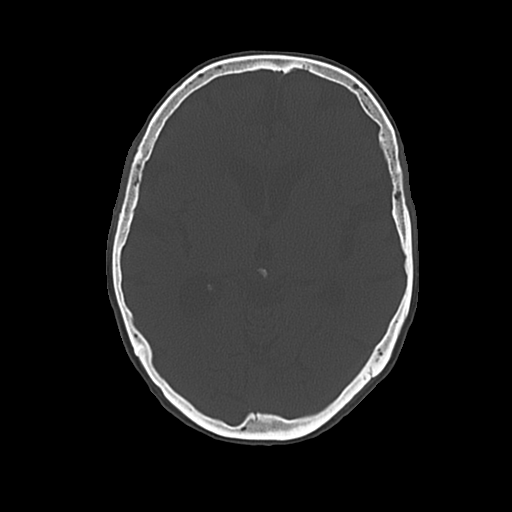
[im 26/47  bone]
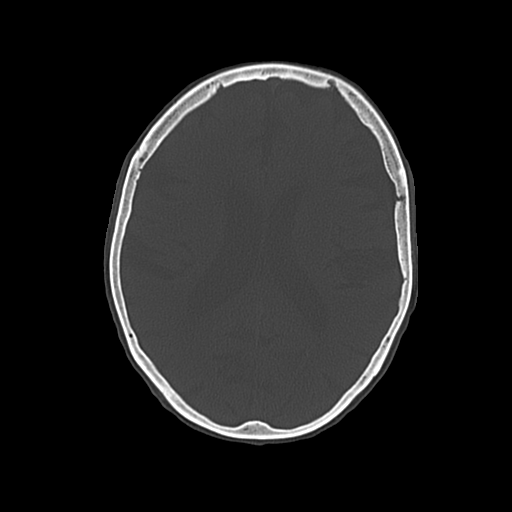
[im 31/47  bone]
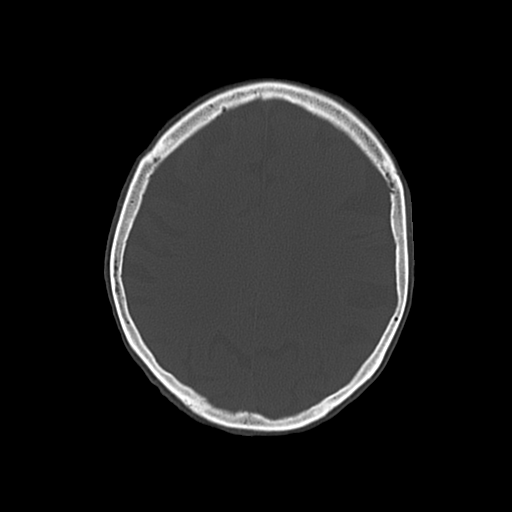
[im 36/47  bone]
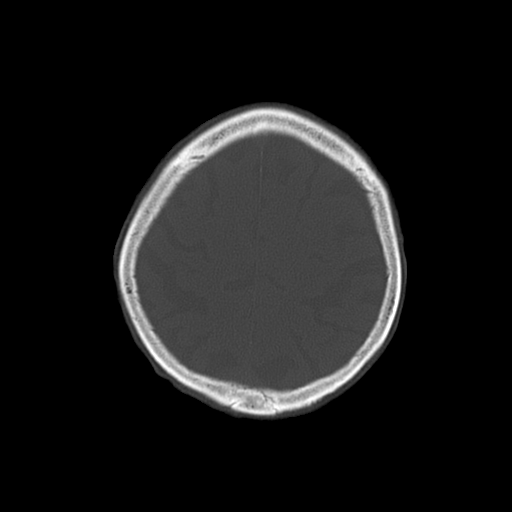
[im 41/47  bone]
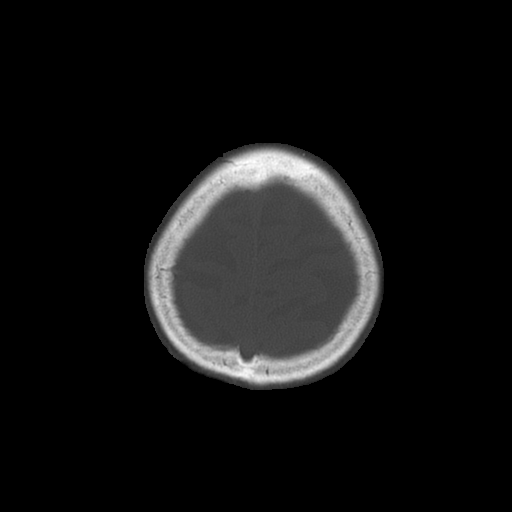

[17 of 30 positions shown; findings below may reference images not displayed]

FINDINGS: No acute intracranial hemorrhage, acute infarction, mass
lesion, mass effect, midline shift or hydrocephalus.  Gray-white
differentiation is preserved throughout.  The tip of the stable
pattern of cerebral and cerebellar volume loss and periventricular,
subcortical and deep white matter hypoattenuation.  No focal soft
tissue L or calvarial abnormality.  Normal aeration of the mastoid
air cells and visualized paranasal sinuses.  Atherosclerotic
calcifications noted in the bilateral cavernous carotid arteries.
IMPRESSION: 1.  No acute intracranial abnormality.
2.  Stable atrophy and chronic microvascular ischemic white matter
changes.
